# Patient Record
Sex: Male | Born: 1950 | Race: Black or African American | Hispanic: No | Marital: Married | Smoking: Current every day smoker
Health system: Southern US, Community
[De-identification: ages and names within clinical notes are randomized; demographics above are authoritative.]

## PROBLEM LIST (undated history)

## (undated) DIAGNOSIS — E785 Hyperlipidemia, unspecified: Secondary | ICD-10-CM

## (undated) DIAGNOSIS — Z720 Tobacco use: Secondary | ICD-10-CM

## (undated) DIAGNOSIS — I10 Essential (primary) hypertension: Secondary | ICD-10-CM

## (undated) HISTORY — DX: Tobacco use: Z72.0

## (undated) HISTORY — DX: Essential (primary) hypertension: I10

## (undated) HISTORY — DX: Hyperlipidemia, unspecified: E78.5

---

## 1978-02-27 HISTORY — PX: VOCAL CORD LATERALIZATION, ENDOSCOPIC APPROACH W/ MLB: SHX2664

## 2014-01-07 LAB — PSA

## 2014-03-17 ENCOUNTER — Ambulatory Visit: Payer: Self-pay | Admitting: Gastroenterology

## 2014-03-17 LAB — HM COLONOSCOPY

## 2014-06-22 LAB — SURGICAL PATHOLOGY

## 2014-10-14 ENCOUNTER — Ambulatory Visit (INDEPENDENT_AMBULATORY_CARE_PROVIDER_SITE_OTHER): Payer: Managed Care, Other (non HMO) | Admitting: Unknown Physician Specialty

## 2014-10-14 ENCOUNTER — Encounter: Payer: Self-pay | Admitting: Unknown Physician Specialty

## 2014-10-14 VITALS — BP 125/79 | HR 87 | Temp 98.7°F | Ht 72.2 in | Wt 207.6 lb

## 2014-10-14 DIAGNOSIS — Z720 Tobacco use: Secondary | ICD-10-CM

## 2014-10-14 DIAGNOSIS — L729 Follicular cyst of the skin and subcutaneous tissue, unspecified: Secondary | ICD-10-CM

## 2014-10-14 DIAGNOSIS — Q181 Preauricular sinus and cyst: Secondary | ICD-10-CM

## 2014-10-14 DIAGNOSIS — E7800 Pure hypercholesterolemia, unspecified: Secondary | ICD-10-CM | POA: Insufficient documentation

## 2014-10-14 DIAGNOSIS — I10 Essential (primary) hypertension: Secondary | ICD-10-CM

## 2014-10-14 DIAGNOSIS — E785 Hyperlipidemia, unspecified: Secondary | ICD-10-CM

## 2014-10-14 LAB — LIPID PANEL PICCOLO, WAIVED
CHOL/HDL RATIO PICCOLO,WAIVE: 3.5 mg/dL
Cholesterol Piccolo, Waived: 190 mg/dL (ref <200)
HDL CHOL PICCOLO, WAIVED: 55 mg/dL — AB (ref >59)
LDL CHOL CALC PICCOLO WAIVED: 77 mg/dL (ref <100)
TRIGLYCERIDES PICCOLO,WAIVED: 291 mg/dL — AB (ref <150)
VLDL CHOL CALC PICCOLO,WAIVE: 58 mg/dL — AB (ref <30)

## 2014-10-14 MED ORDER — AMLODIPINE BESYLATE 5 MG PO TABS: 5.0000 mg | ORAL_TABLET | Freq: Every day | ORAL | Status: DC

## 2014-10-14 NOTE — Progress Notes (Signed)
BP 125/79 mmHg  Pulse 87  Temp(Src) 98.7 F (37.1 C)  Ht 6' 0.2" (1.834 m)  Wt 207 lb 9.6 oz (94.167 kg)  BMI 28.00 kg/m2  SpO2 99%   Subjective:    Patient ID: James Davidson, male    DOB: 02/21/1951, 64 y.o.   MRN: [id number]  HPI: James Davidson is a 64 y.o. male  Chief Complaint  Patient presents with  . Hyperlipidemia  . Hypertension   HPI Comments: Reviewed ASCVC calculator last visit.  Lost weight but doesn't remember anything being wrong with cholesterol.     Hypertension This is a chronic (Taking Lisinopril and Lotensin) problem. The problem is controlled. Pertinent negatives include no chest pain, malaise/fatigue, palpitations or shortness of breath. (Complaining of ED, dizzyness upon standing, and occasional lip swelling) The current treatment provides significant improvement. There are no compliance problems.     Relevant past medical, surgical, family and social history reviewed and updated as indicated. Interim medical history since our last visit reviewed. Allergies and medications reviewed and updated.  Review of Systems  Constitutional: Negative for malaise/fatigue.  HENT:       Large non-tender lump behind left ear for quite some time  Respiratory: Negative for shortness of breath.   Cardiovascular: Negative for chest pain and palpitations.    Per HPI unless specifically indicated above     Objective:    BP 125/79 mmHg  Pulse 87  Temp(Src) 98.7 F (37.1 C)  Ht 6' 0.2" (1.834 m)  Wt 207 lb 9.6 oz (94.167 kg)  BMI 28.00 kg/m2  SpO2 99%  Wt Readings from Last 3 Encounters:  10/14/14 207 lb 9.6 oz (94.167 kg)  05/15/14 215 lb (97.523 kg)    Physical Exam  Constitutional: He is oriented to person, place, and time. He appears well-developed and well-nourished. No distress.  HENT:  Head: Normocephalic and atraumatic.  Large lump behind left ear.  Has been present "for awhile."  Eyes: Conjunctivae and lids are normal. Right eye exhibits no  discharge. Left eye exhibits no discharge. No scleral icterus.  Cardiovascular: Normal rate, regular rhythm and normal heart sounds.   Pulmonary/Chest: Effort normal and breath sounds normal. No respiratory distress.  Abdominal: Normal appearance. There is no splenomegaly or hepatomegaly.  Musculoskeletal: Normal range of motion.  Neurological: He is alert and oriented to person, place, and time.  Skin: Skin is intact. No rash noted. No pallor.  Psychiatric: He has a normal mood and affect. His behavior is normal. Judgment and thought content normal.  Vitals reviewed.    Assessment & Plan:   Problem List Items Addressed This Visit      Unprioritized   Hypertension - Primary    Pt with side effects of ED, lip swelling, and dizzyness.  Will stop Lisinopril 20 mg by titrating down over 2 weeks.  Break in half for 2 weeks and then stop        Relevant Medications   amLODipine (NORVASC) 5 MG tablet   Other Relevant Orders   Lipid Panel Piccolo, Waived   Comprehensive metabolic panel   Hyperlipidemia    ASCVD calculator recommends statin therapy.  Pt refuses      Relevant Medications   amLODipine (NORVASC) 5 MG tablet   Other Relevant Orders   Lipid Panel Piccolo, Waived    Other Visit Diagnoses    Cyst on ear        Refer to ENT    Relevant Orders    Ambulatory referral  to ENT        Follow up plan: No Follow-up on file.

## 2014-10-14 NOTE — Patient Instructions (Addendum)
Will stop Lisinopril 20 mg by titrating down over 2 weeks.  Break in half for 2 weeks and then stop .  I need to see you back in 4 weeks to see if your BP is doing OK.

## 2014-10-14 NOTE — Assessment & Plan Note (Signed)
Pt with side effects of ED, lip swelling, and dizzyness.  Will stop Lisinopril 20 mg by titrating down over 2 weeks.  Break in half for 2 weeks and then stop

## 2014-10-14 NOTE — Assessment & Plan Note (Signed)
ASCVD calculator recommends statin therapy.  Pt refuses

## 2014-10-15 LAB — COMPREHENSIVE METABOLIC PANEL
ALK PHOS: 108 IU/L (ref 39–117)
ALT: 14 IU/L (ref 0–44)
AST: 21 IU/L (ref 0–40)
Albumin/Globulin Ratio: 1.5 (ref 1.1–2.5)
Albumin: 4.1 g/dL (ref 3.6–4.8)
BILIRUBIN TOTAL: 0.3 mg/dL (ref 0.0–1.2)
BUN/Creatinine Ratio: 25 — ABNORMAL HIGH (ref 10–22)
BUN: 24 mg/dL (ref 8–27)
CHLORIDE: 99 mmol/L (ref 97–108)
CO2: 20 mmol/L (ref 18–29)
Calcium: 9.3 mg/dL (ref 8.6–10.2)
Creatinine, Ser: 0.96 mg/dL (ref 0.76–1.27)
GFR calc Af Amer: 97 mL/min/{1.73_m2} (ref >59)
GFR calc non Af Amer: 84 mL/min/{1.73_m2} (ref >59)
GLUCOSE: 94 mg/dL (ref 65–99)
Globulin, Total: 2.7 g/dL (ref 1.5–4.5)
Potassium: 4.2 mmol/L (ref 3.5–5.2)
Sodium: 138 mmol/L (ref 134–144)
Total Protein: 6.8 g/dL (ref 6.0–8.5)

## 2014-10-16 ENCOUNTER — Other Ambulatory Visit: Payer: Self-pay | Admitting: Unknown Physician Specialty

## 2014-10-16 DIAGNOSIS — H938X2 Other specified disorders of left ear: Secondary | ICD-10-CM

## 2014-10-22 ENCOUNTER — Ambulatory Visit
Admission: RE | Admit: 2014-10-22 | Discharge: 2014-10-22 | Disposition: A | Discharge status: Home / Self Care | Payer: Managed Care, Other (non HMO) | Source: Ambulatory Visit | Attending: Unknown Physician Specialty | Admitting: Unknown Physician Specialty

## 2014-10-22 DIAGNOSIS — M47812 Spondylosis without myelopathy or radiculopathy, cervical region: Secondary | ICD-10-CM | POA: Diagnosis not present

## 2014-10-22 DIAGNOSIS — M4802 Spinal stenosis, cervical region: Secondary | ICD-10-CM | POA: Diagnosis not present

## 2014-10-22 DIAGNOSIS — H938X2 Other specified disorders of left ear: Secondary | ICD-10-CM | POA: Insufficient documentation

## 2014-10-22 DIAGNOSIS — J329 Chronic sinusitis, unspecified: Secondary | ICD-10-CM | POA: Diagnosis not present

## 2014-10-22 MED ORDER — IOHEXOL 300 MG/ML  SOLN
75.0000 mL | Freq: Once | INTRAMUSCULAR | Status: AC | PRN
  Administered 2014-10-22: 75 mL via INTRAVENOUS

## 2014-11-20 ENCOUNTER — Encounter: Payer: Self-pay | Admitting: Unknown Physician Specialty

## 2014-11-20 ENCOUNTER — Ambulatory Visit (INDEPENDENT_AMBULATORY_CARE_PROVIDER_SITE_OTHER): Payer: Managed Care, Other (non HMO) | Admitting: Unknown Physician Specialty

## 2014-11-20 VITALS — BP 133/73 | HR 64 | Temp 97.3°F | Ht 72.5 in | Wt 209.2 lb

## 2014-11-20 DIAGNOSIS — I10 Essential (primary) hypertension: Secondary | ICD-10-CM | POA: Diagnosis not present

## 2014-11-20 NOTE — Assessment & Plan Note (Signed)
Well controlled on amlodipine 5 mg.

## 2014-11-20 NOTE — Progress Notes (Signed)
BP 133/73 mmHg  Pulse 64  Temp(Src) 97.3 F (36.3 C)  Ht 6' 0.5" (1.842 m)  Wt 209 lb 3.2 oz (94.892 kg)  BMI 27.97 kg/m2  SpO2 97%   Subjective:    Patient ID: James Davidson, male    DOB: 13-Mar-1950, 64 y.o.   MRN: [id number]  HPI: James Davidson is a 64 y.o. male  Chief Complaint  Patient presents with  . Hypertension  . Gastrophageal Reflux    pt states has had indigestion on and off for about 3 months   Hypertension: Started on amlodipine about a month ago. Compliant with medication, no missed doses. Does not monitor blood pressure at home. Denies dizziness, chest pain, shortness of breath or headaches. Denies any side effects.  GERD: Has recently had episodes of indigestion after eating. Has not tried any over the counter medications. Suggested OTC zantac.   Relevant past medical, surgical, family and social history reviewed and updated as indicated. Interim medical history since our last visit reviewed. Allergies and medications reviewed and updated.  Review of Systems  Constitutional: Negative.  Negative for fever, chills, activity change and appetite change.  HENT: Negative for postnasal drip, rhinorrhea, sinus pressure and sore throat.   Eyes: Negative.  Negative for pain and discharge.  Respiratory: Negative.  Negative for cough, chest tightness, shortness of breath and wheezing.   Cardiovascular: Negative for chest pain, palpitations and leg swelling.  Musculoskeletal: Negative.  Negative for myalgias, back pain, joint swelling, arthralgias and gait problem.  Skin: Negative.  Negative for color change, pallor, rash and wound.  Psychiatric/Behavioral: Negative.  Negative for suicidal ideas, confusion, self-injury and decreased concentration. The patient is not nervous/anxious.     Per HPI unless specifically indicated above     Objective:    BP 133/73 mmHg  Pulse 64  Temp(Src) 97.3 F (36.3 C)  Ht 6' 0.5" (1.842 m)  Wt 209 lb 3.2 oz (94.892 kg)  BMI  27.97 kg/m2  SpO2 97%  Wt Readings from Last 3 Encounters:  11/20/14 209 lb 3.2 oz (94.892 kg)  10/14/14 207 lb 9.6 oz (94.167 kg)  05/15/14 215 lb (97.523 kg)    Physical Exam  Constitutional: He is oriented to person, place, and time. He appears well-developed and well-nourished. No distress.  HENT:  Head: Normocephalic and atraumatic.  Eyes: Conjunctivae are normal.  Neck: Normal range of motion.  Cardiovascular: Normal rate, regular rhythm and normal heart sounds.   Pulmonary/Chest: Effort normal and breath sounds normal. No respiratory distress. He has no wheezes. He has no rales. He exhibits no tenderness.  Musculoskeletal: Normal range of motion.  Neurological: He is alert and oriented to person, place, and time.  Skin: Skin is warm and dry. No rash noted. He is not diaphoretic. No erythema. No pallor.  Psychiatric: He has a normal mood and affect. His behavior is normal. Judgment and thought content normal.    Results for orders placed or performed in visit on 10/14/14  Lipid Panel Piccolo, [employer]  Result Value Ref Range   Cholesterol Piccolo, Waived 190 <200 mg/dL   HDL Chol Piccolo, Waived 55 (L) >59 mg/dL   Triglycerides Piccolo,Waived 291 (H) <150 mg/dL   Chol/HDL Ratio Piccolo,Waive 3.5 mg/dL   LDL Chol Calc Piccolo Waived 77 <100 mg/dL   VLDL Chol Calc Piccolo,Waive 58 (H) <30 mg/dL  Comprehensive metabolic panel  Result Value Ref Range   Glucose 94 65 - 99 mg/dL   BUN 24 8 - 27 mg/dL  Creatinine, Ser 0.96 0.76 - 1.27 mg/dL   GFR calc non Af Amer 84 >59 mL/min/1.73   GFR calc Af Amer 97 >59 mL/min/1.73   BUN/Creatinine Ratio 25 (H) 10 - 22   Sodium 138 134 - 144 mmol/L   Potassium 4.2 3.5 - 5.2 mmol/L   Chloride 99 97 - 108 mmol/L   CO2 20 18 - 29 mmol/L   Calcium 9.3 8.6 - 10.2 mg/dL   Total Protein 6.8 6.0 - 8.5 g/dL   Albumin 4.1 3.6 - 4.8 g/dL   Globulin, Total 2.7 1.5 - 4.5 g/dL   Albumin/Globulin Ratio 1.5 1.1 - 2.5   Bilirubin Total 0.3 0.0 - 1.2  mg/dL   Alkaline Phosphatase 108 39 - 117 IU/L   AST 21 0 - 40 IU/L   ALT 14 0 - 44 IU/L      Assessment & Plan:   Problem List Items Addressed This Visit      Unprioritized   Hypertension - Primary    Well controlled on amlodipine .           Follow up plan: Return in about 6 months (around 05/20/2015).

## 2014-12-23 ENCOUNTER — Other Ambulatory Visit: Payer: Self-pay | Admitting: Unknown Physician Specialty

## 2014-12-29 HISTORY — PX: EXTERNAL EAR SURGERY: SHX627

## 2015-03-03 ENCOUNTER — Other Ambulatory Visit: Payer: Self-pay | Admitting: Unknown Physician Specialty

## 2015-04-07 ENCOUNTER — Other Ambulatory Visit: Payer: Self-pay | Admitting: Unknown Physician Specialty

## 2015-05-21 ENCOUNTER — Ambulatory Visit (INDEPENDENT_AMBULATORY_CARE_PROVIDER_SITE_OTHER): Payer: Managed Care, Other (non HMO) | Admitting: Unknown Physician Specialty

## 2015-05-21 ENCOUNTER — Encounter: Payer: Self-pay | Admitting: Unknown Physician Specialty

## 2015-05-21 VITALS — BP 146/69 | HR 76 | Temp 98.2°F | Ht 72.2 in | Wt 213.2 lb

## 2015-05-21 DIAGNOSIS — Z720 Tobacco use: Secondary | ICD-10-CM | POA: Diagnosis not present

## 2015-05-21 DIAGNOSIS — I10 Essential (primary) hypertension: Secondary | ICD-10-CM

## 2015-05-21 MED ORDER — AMLODIPINE BESYLATE 5 MG PO TABS: 5.0000 mg | ORAL_TABLET | Freq: Every day | ORAL | Status: DC

## 2015-05-21 NOTE — Assessment & Plan Note (Signed)
Encouraged to quit. 

## 2015-05-21 NOTE — Assessment & Plan Note (Signed)
Stable, continue present medications.   

## 2015-05-21 NOTE — Progress Notes (Signed)
BP 146/69 mmHg  Pulse 76  Temp(Src) 98.2 F (36.8 C)  Ht 6' 0.2" (1.834 m)  Wt 213 lb 3.2 oz (96.707 kg)  BMI 28.75 kg/m2  SpO2 97%   Subjective:    Patient ID: James Davidson, male    DOB: May 07, 1950, 65 y.o.   MRN: [id number]  HPI: James Davidson is a 65 y.o. male  Chief Complaint  Patient presents with  . Hypertension   Hypertension Using medications without difficulty Average home BPs checks occasionally but doesn't know what they are.     No problems or lightheadedness No chest pain with exertion or shortness of breath No Edema  Tobacco Smokes 1/3 ppd.  Not interested in quitting  Relevant past medical, surgical, family and social history reviewed and updated as indicated. Interim medical history since our last visit reviewed. Allergies and medications reviewed and updated.  Review of Systems  Per HPI unless specifically indicated above     Objective:    BP 146/69 mmHg  Pulse 76  Temp(Src) 98.2 F (36.8 C)  Ht 6' 0.2" (1.834 m)  Wt 213 lb 3.2 oz (96.707 kg)  BMI 28.75 kg/m2  SpO2 97%  Wt Readings from Last 3 Encounters:  05/21/15 213 lb 3.2 oz (96.707 kg)  11/20/14 209 lb 3.2 oz (94.892 kg)  10/14/14 207 lb 9.6 oz (94.167 kg)    Physical Exam  Constitutional: He is oriented to person, place, and time. He appears well-developed and well-nourished. No distress.  HENT:  Head: Normocephalic and atraumatic.  Eyes: Conjunctivae and lids are normal. Right eye exhibits no discharge. Left eye exhibits no discharge. No scleral icterus.  Neck: Normal range of motion. Neck supple. No JVD present. Carotid bruit is not present.  Cardiovascular: Normal rate, regular rhythm and normal heart sounds.   Pulmonary/Chest: Effort normal and breath sounds normal. No respiratory distress.  Abdominal: Normal appearance. There is no splenomegaly or hepatomegaly.  Musculoskeletal: Normal range of motion.  Neurological: He is alert and oriented to person, place, and time.   Skin: Skin is warm, dry and intact. No rash noted. No pallor.  Psychiatric: He has a normal mood and affect. His behavior is normal. Judgment and thought content normal.    Results for orders placed or performed in visit on 10/14/14  Lipid Panel Piccolo, [employer]  Result Value Ref Range   Cholesterol Piccolo, Waived 190 <200 mg/dL   HDL Chol Piccolo, Waived 55 (L) >59 mg/dL   Triglycerides Piccolo,Waived 291 (H) <150 mg/dL   Chol/HDL Ratio Piccolo,Waive 3.5 mg/dL   LDL Chol Calc Piccolo Waived 77 <100 mg/dL   VLDL Chol Calc Piccolo,Waive 58 (H) <30 mg/dL  Comprehensive metabolic panel  Result Value Ref Range   Glucose 94 65 - 99 mg/dL   BUN 24 8 - 27 mg/dL   Creatinine, Ser 8.650.96 0.76 - 1.27 mg/dL   GFR calc non Af Amer 84 >59 mL/min/1.73   GFR calc Af Amer 97 >59 mL/min/1.73   BUN/Creatinine Ratio 25 (H) 10 - 22   Sodium 138 134 - 144 mmol/L   Potassium 4.2 3.5 - 5.2 mmol/L   Chloride 99 97 - 108 mmol/L   CO2 20 18 - 29 mmol/L   Calcium 9.3 8.6 - 10.2 mg/dL   Total Protein 6.8 6.0 - 8.5 g/dL   Albumin 4.1 3.6 - 4.8 g/dL   Globulin, Total 2.7 1.5 - 4.5 g/dL   Albumin/Globulin Ratio 1.5 1.1 - 2.5   Bilirubin Total 0.3 0.0 - 1.2  mg/dL   Alkaline Phosphatase 108 39 - 117 IU/L   AST 21 0 - 40 IU/L   ALT 14 0 - 44 IU/L      Assessment & Plan:   Problem List Items Addressed This Visit      Unprioritized   Hypertension - Primary    Stable, continue present medications.        Tobacco use    Encouraged to quit          Follow up plan: Return in about 3 months (around 08/21/2015) for physical.

## 2015-08-23 ENCOUNTER — Ambulatory Visit (INDEPENDENT_AMBULATORY_CARE_PROVIDER_SITE_OTHER): Payer: Managed Care, Other (non HMO) | Admitting: Unknown Physician Specialty

## 2015-08-23 ENCOUNTER — Encounter: Payer: Self-pay | Admitting: Unknown Physician Specialty

## 2015-08-23 VITALS — BP 132/79 | HR 81 | Temp 98.0°F | Ht 72.5 in | Wt 218.6 lb

## 2015-08-23 DIAGNOSIS — Z0000 Encounter for general adult medical examination without abnormal findings: Secondary | ICD-10-CM

## 2015-08-23 DIAGNOSIS — Z720 Tobacco use: Secondary | ICD-10-CM

## 2015-08-23 DIAGNOSIS — K219 Gastro-esophageal reflux disease without esophagitis: Secondary | ICD-10-CM | POA: Insufficient documentation

## 2015-08-23 DIAGNOSIS — I10 Essential (primary) hypertension: Secondary | ICD-10-CM

## 2015-08-23 MED ORDER — OMEPRAZOLE 20 MG PO CPDR: 20.0000 mg | DELAYED_RELEASE_CAPSULE | Freq: Every day | ORAL | Status: DC

## 2015-08-23 MED ORDER — VALSARTAN 80 MG PO TABS: 80.0000 mg | ORAL_TABLET | Freq: Every day | ORAL | Status: DC

## 2015-08-23 NOTE — Assessment & Plan Note (Signed)
Encouraged to quit.  Schedule low dose CT 

## 2015-08-23 NOTE — Assessment & Plan Note (Signed)
ED with Amlodipine.  Will change to an ARB

## 2015-08-23 NOTE — Progress Notes (Signed)
BP 132/79 mmHg  Pulse 81  Temp(Src) 98 F (36.7 C)  Ht 6' 0.5" (1.842 m)  Wt 218 lb 9.6 oz (99.156 kg)  BMI 29.22 kg/m2  SpO2 94%   Subjective:    Patient ID: James Davidson, male    DOB: Jun 27, 1950, 65 y.o.   MRN: [id number]  HPI: James Davidson is a 65 y.o. male  Chief Complaint  Patient presents with  . Annual Exam    Hep C and HIV orders entered  . Gastroesophageal Reflux    pt states he wants to talk to provider about taking prilosec   Hypertension Using medications without difficulty but states that ever since he has been on the medication "ED" has set in and having trouble having and maintaining an erection Average home BPs Not checking   No problems or lightheadedness No chest pain with exertion or shortness of breath No Edema   GERD Pt with indigestion and feeling like he needs to burp.  Sometimes it makes him sick.  He took OTC Prilosec which has resolved the issues.    Tobacco use 1/2 ppd.  He has smoked for many years and smokes to relieve stress  Relevant past medical, surgical, family and social history reviewed and updated as indicated. Interim medical history since our last visit reviewed. Allergies and medications reviewed and updated.  Review of Systems  Constitutional: Negative.   HENT: Negative.   Eyes: Negative.   Respiratory: Negative.   Cardiovascular: Negative.   Gastrointestinal: Negative.   Endocrine: Negative.   Musculoskeletal: Negative.   Skin: Negative.   Allergic/Immunologic: Negative.   Neurological: Negative.   Hematological: Negative.   Psychiatric/Behavioral: Negative.     Per HPI unless specifically indicated above     Objective:    BP 132/79 mmHg  Pulse 81  Temp(Src) 98 F (36.7 C)  Ht 6' 0.5" (1.842 m)  Wt 218 lb 9.6 oz (99.156 kg)  BMI 29.22 kg/m2  SpO2 94%  Wt Readings from Last 3 Encounters:  08/23/15 218 lb 9.6 oz (99.156 kg)  05/21/15 213 lb 3.2 oz (96.707 kg)  11/20/14 209 lb 3.2 oz (94.892 kg)     Physical Exam  Constitutional: He is oriented to person, place, and time. He appears well-developed and well-nourished.  HENT:  Head: Normocephalic.  Right Ear: Tympanic membrane, external ear and ear canal normal.  Left Ear: Tympanic membrane, external ear and ear canal normal.  Mouth/Throat: Uvula is midline, oropharynx is clear and moist and mucous membranes are normal.  Eyes: Pupils are equal, round, and reactive to light.  Cardiovascular: Normal rate, regular rhythm and normal heart sounds.  Exam reveals no gallop and no friction rub.   No murmur heard. Pulmonary/Chest: Effort normal and breath sounds normal. No respiratory distress.  Abdominal: Soft. Bowel sounds are normal. He exhibits no distension. There is no tenderness.  Genitourinary: Rectum normal and prostate normal.  Musculoskeletal: Normal range of motion.  Neurological: He is alert and oriented to person, place, and time. He has normal reflexes.  Skin: Skin is warm and dry.  Psychiatric: He has a normal mood and affect. His behavior is normal. Judgment and thought content normal.    Results for orders placed or performed in visit on 10/14/14  Lipid Panel Piccolo, [employer]  Result Value Ref Range   Cholesterol Piccolo, Waived 190 <200 mg/dL   HDL Chol Piccolo, Waived 55 (L) >59 mg/dL   Triglycerides Piccolo,Waived 291 (H) <150 mg/dL   Chol/HDL Ratio Piccolo,Waive 3.5  mg/dL   LDL Chol Calc Piccolo Waived 77 <100 mg/dL   VLDL Chol Calc Piccolo,Waive 58 (H) <30 mg/dL  Comprehensive metabolic panel  Result Value Ref Range   Glucose 94 65 - 99 mg/dL   BUN 24 8 - 27 mg/dL   Creatinine, Ser 5.620.96 0.76 - 1.27 mg/dL   GFR calc non Af Amer 84 >59 mL/min/1.73   GFR calc Af Amer 97 >59 mL/min/1.73   BUN/Creatinine Ratio 25 (H) 10 - 22   Sodium 138 134 - 144 mmol/L   Potassium 4.2 3.5 - 5.2 mmol/L   Chloride 99 97 - 108 mmol/L   CO2 20 18 - 29 mmol/L   Calcium 9.3 8.6 - 10.2 mg/dL   Total Protein 6.8 6.0 - 8.5 g/dL    Albumin 4.1 3.6 - 4.8 g/dL   Globulin, Total 2.7 1.5 - 4.5 g/dL   Albumin/Globulin Ratio 1.5 1.1 - 2.5   Bilirubin Total 0.3 0.0 - 1.2 mg/dL   Alkaline Phosphatase 108 39 - 117 IU/L   AST 21 0 - 40 IU/L   ALT 14 0 - 44 IU/L      Assessment & Plan:   Problem List Items Addressed This Visit      Unprioritized   GERD (gastroesophageal reflux disease)    Rx for Omeprazole. Discussed to start to calm down symptoms and encouraged to wean off      Relevant Medications   omeprazole (PRILOSEC) 20 MG capsule   Hypertension    ED with Amlodipine.  Will change to an ARB      Relevant Medications   valsartan (DIOVAN) 80 MG tablet   Tobacco use    Encouraged to quit.  Schedule low dose CT       Other Visit Diagnoses    Health care maintenance    -  Primary    Relevant Orders    Hepatitis C antibody    HIV antibody        Follow up plan: Return in about 4 weeks (around 09/20/2015).

## 2015-08-23 NOTE — Assessment & Plan Note (Signed)
Rx for Omeprazole. Discussed to start to calm down symptoms and encouraged to wean off

## 2015-08-24 LAB — HEPATITIS C ANTIBODY: Hep C Virus Ab: <0.1 s/co ratio (ref 0.0–0.9)

## 2015-08-24 LAB — HIV ANTIBODY (ROUTINE TESTING W REFLEX): HIV SCREEN 4TH GENERATION: NONREACTIVE

## 2015-09-01 ENCOUNTER — Telehealth: Payer: Self-pay | Admitting: *Deleted

## 2015-09-01 NOTE — Telephone Encounter (Signed)
Received referral for initial lung cancer screening scan. Contacted patient and obtained smoking history, (20 pack year)which is less than current accepted criteria of 30 pack year history. Discussed smoking cessation programs available here at [facility] as well as encouraging continued discussion about screening in the future if he continues to smoke. Patient verbalizes understanding.

## 2015-09-21 ENCOUNTER — Ambulatory Visit: Payer: Managed Care, Other (non HMO) | Admitting: Unknown Physician Specialty

## 2015-11-03 ENCOUNTER — Other Ambulatory Visit: Payer: Self-pay | Admitting: Unknown Physician Specialty

## 2015-12-09 ENCOUNTER — Other Ambulatory Visit: Payer: Self-pay | Admitting: Family Medicine

## 2015-12-31 ENCOUNTER — Ambulatory Visit (INDEPENDENT_AMBULATORY_CARE_PROVIDER_SITE_OTHER): Payer: Medicare Other | Admitting: Unknown Physician Specialty

## 2015-12-31 ENCOUNTER — Encounter: Payer: Self-pay | Admitting: Unknown Physician Specialty

## 2015-12-31 DIAGNOSIS — E782 Mixed hyperlipidemia: Secondary | ICD-10-CM | POA: Diagnosis not present

## 2015-12-31 DIAGNOSIS — K219 Gastro-esophageal reflux disease without esophagitis: Secondary | ICD-10-CM

## 2015-12-31 DIAGNOSIS — Z720 Tobacco use: Secondary | ICD-10-CM

## 2015-12-31 DIAGNOSIS — I10 Essential (primary) hypertension: Secondary | ICD-10-CM

## 2015-12-31 MED ORDER — VALSARTAN 80 MG PO TABS: 80.0000 mg | ORAL_TABLET | Freq: Every day | ORAL | 0 refills | Status: DC

## 2015-12-31 MED ORDER — VALSARTAN 80 MG PO TABS: 80.0000 mg | ORAL_TABLET | Freq: Every day | ORAL | 1 refills | Status: DC

## 2015-12-31 NOTE — Assessment & Plan Note (Signed)
Will recheck today. He did eat a piece of chicken this morning.

## 2015-12-31 NOTE — Progress Notes (Signed)
BP (!) 169/100 (BP Location: Left Arm, Patient Position: Sitting, Cuff Size: Large)   Pulse 84   Temp 97.8 F (36.6 C)   Ht 6' 1.1" (1.857 m)   Wt 227 lb 6.4 oz (103.1 kg)   SpO2 97%   BMI 29.92 kg/m    Subjective:    Patient ID: James Davidson, male    DOB: 10-Sep-1950, 65 y.o.   MRN: [id number]  HPI: James Davidson is a 65 y.o. male  Chief Complaint  Patient presents with  . Hypertension  . Gastroesophageal Reflux    Hypertension  Has been without Valsartan for at least 6 weeks. Wasn't having any difficulties before he ran out.   Average home BP  Responded well to valsartan when he was taking it. Reports it has been high the last couple of weeks.   Using medication without problems or lightheadedness  No chest pain with exertion or shortness of breath No Edema  GERD Taking prilosec, not currently having symptoms.  Relevant past medical, surgical, family and social history reviewed and updated as indicated. Interim medical history since our last visit reviewed. Allergies and medications reviewed and updated.  Review of Systems  Constitutional: Negative.   Respiratory: Negative.   Cardiovascular: Negative.   Neurological: Negative.     Per HPI unless specifically indicated above     Objective:    BP (!) 169/100 (BP Location: Left Arm, Patient Position: Sitting, Cuff Size: Large)   Pulse 84   Temp 97.8 F (36.6 C)   Ht 6' 1.1" (1.857 m)   Wt 227 lb 6.4 oz (103.1 kg)   SpO2 97%   BMI 29.92 kg/m   Wt Readings from Last 3 Encounters:  12/31/15 227 lb 6.4 oz (103.1 kg)  08/23/15 218 lb 9.6 oz (99.2 kg)  05/21/15 213 lb 3.2 oz (96.7 kg)    Physical Exam  Constitutional: He is oriented to person, place, and time. He appears well-developed and well-nourished. No distress.  Cardiovascular: Normal rate, regular rhythm and normal heart sounds.   Pulses:      Radial pulses are 2+ on the right side, and 2+ on the left side.  No carotid bruits noted.     Pulmonary/Chest: Effort normal and breath sounds normal.  Musculoskeletal: He exhibits no edema.  Neurological: He is alert and oriented to person, place, and time.  Psychiatric: He has a normal mood and affect. His behavior is normal. Judgment and thought content normal.    Results for orders placed or performed in visit on 08/23/15  Hepatitis C antibody  Result Value Ref Range   Hep C Virus Ab <0.1 0.0 - 0.9 s/co ratio  HIV antibody  Result Value Ref Range   HIV Screen 4th Generation wRfx Non Reactive Non Reactive      Assessment & Plan:   Problem List Items Addressed This Visit      Unprioritized   GERD (gastroesophageal reflux disease)    Stable, continue current medication.       Hyperlipidemia    Will recheck today. He did eat a piece of chicken this morning.       Relevant Medications   valsartan (DIOVAN) 80 MG tablet   Hypertension    Blood pressure elevated. This is likely due to patient running out of medication several weeks ago. Will restart Valsartan today.       Relevant Medications   valsartan (DIOVAN) 80 MG tablet   Other Relevant Orders   Comprehensive metabolic panel  Lipid Panel w/o Chol/HDL Ratio   Tobacco use    Still smoking, although he reports he has cut back, he could not tell me how many cigarettes a day he was smoking. Discussed importance of cessation and reviewed options for quitting.        Other Visit Diagnoses   None.      Follow up plan: Return in about 4 weeks (around 01/28/2016).

## 2015-12-31 NOTE — Assessment & Plan Note (Signed)
Blood pressure elevated. This is likely due to patient running out of medication several weeks ago. Will restart Valsartan today.

## 2015-12-31 NOTE — Assessment & Plan Note (Signed)
Stable, continue current medication.

## 2015-12-31 NOTE — Assessment & Plan Note (Signed)
Still smoking, although he reports he has cut back, he could not tell me how many cigarettes a day he was smoking. Discussed importance of cessation and reviewed options for quitting.

## 2016-01-01 LAB — COMPREHENSIVE METABOLIC PANEL
ALT: 21 IU/L (ref 0–44)
AST: 27 IU/L (ref 0–40)
Albumin/Globulin Ratio: 1.5 (ref 1.2–2.2)
Albumin: 4.1 g/dL (ref 3.6–4.8)
Alkaline Phosphatase: 101 IU/L (ref 39–117)
BUN/Creatinine Ratio: 14 (ref 10–24)
BUN: 17 mg/dL (ref 8–27)
Bilirubin Total: 0.3 mg/dL (ref 0.0–1.2)
CALCIUM: 9.4 mg/dL (ref 8.6–10.2)
CO2: 20 mmol/L (ref 18–29)
CREATININE: 1.19 mg/dL (ref 0.76–1.27)
Chloride: 104 mmol/L (ref 96–106)
GFR calc Af Amer: 74 mL/min/{1.73_m2} (ref >59)
GFR, EST NON AFRICAN AMERICAN: 64 mL/min/{1.73_m2} (ref >59)
GLUCOSE: 104 mg/dL — AB (ref 65–99)
Globulin, Total: 2.7 g/dL (ref 1.5–4.5)
POTASSIUM: 4.2 mmol/L (ref 3.5–5.2)
Sodium: 140 mmol/L (ref 134–144)
Total Protein: 6.8 g/dL (ref 6.0–8.5)

## 2016-01-01 LAB — LIPID PANEL W/O CHOL/HDL RATIO
Cholesterol, Total: 222 mg/dL — ABNORMAL HIGH (ref 100–199)
HDL: 53 mg/dL (ref >39)
LDL CALC: 141 mg/dL — AB (ref 0–99)
TRIGLYCERIDES: 139 mg/dL (ref 0–149)
VLDL Cholesterol Cal: 28 mg/dL (ref 5–40)

## 2016-01-28 ENCOUNTER — Encounter: Payer: Self-pay | Admitting: Unknown Physician Specialty

## 2016-01-28 ENCOUNTER — Ambulatory Visit: Payer: Medicare Other | Admitting: Unknown Physician Specialty

## 2016-01-28 ENCOUNTER — Ambulatory Visit (INDEPENDENT_AMBULATORY_CARE_PROVIDER_SITE_OTHER): Payer: Medicare Other | Admitting: Unknown Physician Specialty

## 2016-01-28 DIAGNOSIS — E782 Mixed hyperlipidemia: Secondary | ICD-10-CM | POA: Diagnosis not present

## 2016-01-28 DIAGNOSIS — I10 Essential (primary) hypertension: Secondary | ICD-10-CM

## 2016-01-28 MED ORDER — VALSARTAN 80 MG PO TABS: 80.0000 mg | ORAL_TABLET | Freq: Every day | ORAL | 1 refills | Status: DC

## 2016-01-28 NOTE — Assessment & Plan Note (Signed)
Not quite to goal but improved.  He's going to start exercising.

## 2016-01-28 NOTE — Assessment & Plan Note (Signed)
Refusing statins for now.  Recheck in 3 months

## 2016-01-28 NOTE — Progress Notes (Signed)
BP 136/82 (BP Location: Left Arm, Cuff Size: Large)   Pulse 81   Temp 97.4 F (36.3 C)   Wt 231 lb 12.8 oz (105.1 kg)   SpO2 97%   BMI 30.50 kg/m    Subjective:    Patient ID: James Davidson, male    DOB: 1950-06-20, 65 y.o.   MRN: [id number]  HPI: James Davidson is a 65 y.o. male  Chief Complaint  Patient presents with  . Hypertension    1 month f/up   Hypertension  Using medications without difficulty Average home BPs   Using medication without problems or lightheadedness No chest pain with exertion or shortness of breath No Edema  Hyperlipidemia Reviewed ASCVD calculator which recommends high intensity statin.    Relevant past medical, surgical, family and social history reviewed and updated as indicated. Interim medical history since our last visit reviewed. Allergies and medications reviewed and updated.  Review of Systems  Per HPI unless specifically indicated above     Objective:    BP 136/82 (BP Location: Left Arm, Cuff Size: Large)   Pulse 81   Temp 97.4 F (36.3 C)   Wt 231 lb 12.8 oz (105.1 kg)   SpO2 97%   BMI 30.50 kg/m   Wt Readings from Last 3 Encounters:  01/28/16 231 lb 12.8 oz (105.1 kg)  12/31/15 227 lb 6.4 oz (103.1 kg)  08/23/15 218 lb 9.6 oz (99.2 kg)    Physical Exam  Constitutional: He is oriented to person, place, and time. He appears well-developed and well-nourished. No distress.  HENT:  Head: Normocephalic and atraumatic.  Eyes: Conjunctivae and lids are normal. Right eye exhibits no discharge. Left eye exhibits no discharge. No scleral icterus.  Neck: Normal range of motion. Neck supple. No JVD present. Carotid bruit is not present.  Cardiovascular: Normal rate, regular rhythm and normal heart sounds.   Pulmonary/Chest: Effort normal and breath sounds normal. No respiratory distress.  Abdominal: Normal appearance. There is no splenomegaly or hepatomegaly.  Musculoskeletal: Normal range of motion.  Neurological: He is alert  and oriented to person, place, and time.  Skin: Skin is warm, dry and intact. No rash noted. No pallor.  Psychiatric: He has a normal mood and affect. His behavior is normal. Judgment and thought content normal.    Results for orders placed or performed in visit on 12/31/15  Comprehensive metabolic panel  Result Value Ref Range   Glucose 104 (H) 65 - 99 mg/dL   BUN 17 8 - 27 mg/dL   Creatinine, Ser 4.091.19 0.76 - 1.27 mg/dL   GFR calc non Af Amer 64 >59 mL/min/1.73   GFR calc Af Amer 74 >59 mL/min/1.73   BUN/Creatinine Ratio 14 10 - 24   Sodium 140 134 - 144 mmol/L   Potassium 4.2 3.5 - 5.2 mmol/L   Chloride 104 96 - 106 mmol/L   CO2 20 18 - 29 mmol/L   Calcium 9.4 8.6 - 10.2 mg/dL   Total Protein 6.8 6.0 - 8.5 g/dL   Albumin 4.1 3.6 - 4.8 g/dL   Globulin, Total 2.7 1.5 - 4.5 g/dL   Albumin/Globulin Ratio 1.5 1.2 - 2.2   Bilirubin Total 0.3 0.0 - 1.2 mg/dL   Alkaline Phosphatase 101 39 - 117 IU/L   AST 27 0 - 40 IU/L   ALT 21 0 - 44 IU/L  Lipid Panel w/o Chol/HDL Ratio  Result Value Ref Range   Cholesterol, Total 222 (H) 100 - 199 mg/dL   Triglycerides  139 0 - 149 mg/dL   HDL 53 >16>39 mg/dL   VLDL Cholesterol Cal 28 5 - 40 mg/dL   LDL Calculated 109141 (H) 0 - 99 mg/dL      Assessment & Plan:   Problem List Items Addressed This Visit      Unprioritized   Hyperlipidemia    Refusing statins for now.  Recheck in 3 months      Hypertension    Not quite to goal but improved.  He's going to start exercising.           Recheck BP and cholesterol in 3 months  Follow up plan: Return in about 3 months (around 04/27/2016).

## 2016-04-28 ENCOUNTER — Ambulatory Visit: Payer: Medicare Other | Admitting: Unknown Physician Specialty

## 2016-05-02 ENCOUNTER — Encounter: Payer: Self-pay | Admitting: Unknown Physician Specialty

## 2016-05-02 ENCOUNTER — Ambulatory Visit (INDEPENDENT_AMBULATORY_CARE_PROVIDER_SITE_OTHER): Payer: Medicare Other | Admitting: Unknown Physician Specialty

## 2016-05-02 VITALS — BP 160/100 | HR 78 | Temp 98.5°F | Ht 73.2 in | Wt 231.6 lb

## 2016-05-02 DIAGNOSIS — Z720 Tobacco use: Secondary | ICD-10-CM

## 2016-05-02 DIAGNOSIS — E782 Mixed hyperlipidemia: Secondary | ICD-10-CM | POA: Diagnosis not present

## 2016-05-02 DIAGNOSIS — I10 Essential (primary) hypertension: Secondary | ICD-10-CM

## 2016-05-02 LAB — LIPID PANEL PICCOLO, WAIVED
CHOL/HDL RATIO PICCOLO,WAIVE: 3.1 mg/dL
Cholesterol Piccolo, Waived: 205 mg/dL — ABNORMAL HIGH (ref <200)
HDL Chol Piccolo, Waived: 65 mg/dL (ref >59)
LDL Chol Calc Piccolo Waived: 110 mg/dL — ABNORMAL HIGH (ref <100)
Triglycerides Piccolo,Waived: 149 mg/dL (ref <150)
VLDL Chol Calc Piccolo,Waive: 30 mg/dL — ABNORMAL HIGH (ref <30)

## 2016-05-02 MED ORDER — VALSARTAN 160 MG PO TABS: 160.0000 mg | ORAL_TABLET | Freq: Every day | ORAL | 1 refills | Status: DC

## 2016-05-02 NOTE — Assessment & Plan Note (Signed)
Not ready to quit.  

## 2016-05-02 NOTE — Assessment & Plan Note (Signed)
Improved LDL and HDL.  Will continue present treatment

## 2016-05-02 NOTE — Progress Notes (Signed)
BP (!) 160/100   Pulse 78   Temp 98.5 F (36.9 C)   Ht 6' 1.2" (1.859 m)   Wt 231 lb 9.6 oz (105.1 kg)   SpO2 95%   BMI 30.39 kg/m    Subjective:    Patient ID: James Davidson, male    DOB: [birth year], 66 y.o.   MRN: [id number]  HPI: James Davidson is a 66 y.o. male  Chief Complaint  Patient presents with  . Hyperlipidemia  . Hypertension   Hypertension Using medications without difficulty Average home BPs Not checking   No problems or lightheadedness No chest pain with exertion or shortness of breath No Edema   Hyperlipidemia Pt trying to use lifestyle for cholesterol No Muscle aches  Exercise: Walking daily Diet: Eating more fruit   Relevant past medical, surgical, family and social history reviewed and updated as indicated. Interim medical history since our last visit reviewed. Allergies and medications reviewed and updated.  Review of Systems  Per HPI unless specifically indicated above     Objective:    BP (!) 160/100   Pulse 78   Temp 98.5 F (36.9 C)   Ht 6' 1.2" (1.859 m)   Wt 231 lb 9.6 oz (105.1 kg)   SpO2 95%   BMI 30.39 kg/m   Wt Readings from Last 3 Encounters:  05/02/16 231 lb 9.6 oz (105.1 kg)  01/28/16 231 lb 12.8 oz (105.1 kg)  12/31/15 227 lb 6.4 oz (103.1 kg)    Physical Exam  Constitutional: He is oriented to person, place, and time. He appears well-developed and well-nourished. No distress.  HENT:  Head: Normocephalic and atraumatic.  Eyes: Conjunctivae and lids are normal. Right eye exhibits no discharge. Left eye exhibits no discharge. No scleral icterus.  Neck: Normal range of motion. Neck supple. No JVD present. Carotid bruit is not present.  Cardiovascular: Normal rate, regular rhythm and normal heart sounds.   Pulmonary/Chest: Effort normal and breath sounds normal. No respiratory distress.  Abdominal: Normal appearance. There is no splenomegaly or hepatomegaly.  Musculoskeletal: Normal range of motion.  Neurological:  He is alert and oriented to person, place, and time.  Skin: Skin is warm, dry and intact. No rash noted. No pallor.  Psychiatric: He has a normal mood and affect. His behavior is normal. Judgment and thought content normal.    Results for orders placed or performed in visit on 12/31/15  Comprehensive metabolic panel  Result Value Ref Range   Glucose 104 (H) 65 - 99 mg/dL   BUN 17 8 - 27 mg/dL   Creatinine, Ser 4.09 0.76 - 1.27 mg/dL   GFR calc non Af Amer 64 >59 mL/min/1.73   GFR calc Af Amer 74 >59 mL/min/1.73   BUN/Creatinine Ratio 14 10 - 24   Sodium 140 134 - 144 mmol/L   Potassium 4.2 3.5 - 5.2 mmol/L   Chloride 104 96 - 106 mmol/L   CO2 20 18 - 29 mmol/L   Calcium 9.4 8.6 - 10.2 mg/dL   Total Protein 6.8 6.0 - 8.5 g/dL   Albumin 4.1 3.6 - 4.8 g/dL   Globulin, Total 2.7 1.5 - 4.5 g/dL   Albumin/Globulin Ratio 1.5 1.2 - 2.2   Bilirubin Total 0.3 0.0 - 1.2 mg/dL   Alkaline Phosphatase 101 39 - 117 IU/L   AST 27 0 - 40 IU/L   ALT 21 0 - 44 IU/L  Lipid Panel w/o Chol/HDL Ratio  Result Value Ref Range   Cholesterol, Total 222 (  H) 100 - 199 mg/dL   Triglycerides 784139 0 - 149 mg/dL   HDL 53 >69>39 mg/dL   VLDL Cholesterol Cal 28 5 - 40 mg/dL   LDL Calculated 629141 (H) 0 - 99 mg/dL      Assessment & Plan:   Problem List Items Addressed This Visit      Unprioritized   Hypercholesteremia - Primary    Improved LDL and HDL.  Will continue present treatment      Relevant Medications   valsartan (DIOVAN) 160 MG tablet   Hypertension    Not to goal.  Increase Diovan to 160 mg.       Relevant Medications   valsartan (DIOVAN) 160 MG tablet   Tobacco use    Not ready to quit          Follow up plan: Return in about 4 weeks (around 05/30/2016).

## 2016-05-02 NOTE — Assessment & Plan Note (Signed)
Not to goal.  Increase Diovan to 160 mg.

## 2016-06-02 ENCOUNTER — Encounter: Payer: Self-pay | Admitting: Unknown Physician Specialty

## 2016-06-02 ENCOUNTER — Ambulatory Visit (INDEPENDENT_AMBULATORY_CARE_PROVIDER_SITE_OTHER): Payer: Medicare Other | Admitting: Unknown Physician Specialty

## 2016-06-02 DIAGNOSIS — I10 Essential (primary) hypertension: Secondary | ICD-10-CM

## 2016-06-02 NOTE — Progress Notes (Signed)
   BP 120/67 (BP Location: Left Arm, Patient Position: Sitting, Cuff Size: Large)   Pulse 85   Temp 97.9 F (36.6 C)   Wt 223 lb (101.2 kg)   SpO2 97%   BMI 29.26 kg/m    Subjective:    Patient ID: James Davidson, male    DOB: 01/12/1951, 66 y.o.   MRN: [id number]  HPI: James Davidson is a 66 y.o. male  Chief Complaint  Patient presents with  . Hypertension    4 week f/up   Hypertension Using medications without difficulty Average home BPs Not checking as recently down with the flu  No problems or lightheadedness No chest pain with exertion or shortness of breath No Edema  Relevant past medical, surgical, family and social history reviewed and updated as indicated. Interim medical history since our last visit reviewed. Allergies and medications reviewed and updated.  Review of Systems  Per HPI unless specifically indicated above     Objective:    BP 120/67 (BP Location: Left Arm, Patient Position: Sitting, Cuff Size: Large)   Pulse 85   Temp 97.9 F (36.6 C)   Wt 223 lb (101.2 kg)   SpO2 97%   BMI 29.26 kg/m   Wt Readings from Last 3 Encounters:  06/02/16 223 lb (101.2 kg)  05/02/16 231 lb 9.6 oz (105.1 kg)  01/28/16 231 lb 12.8 oz (105.1 kg)    Physical Exam  Constitutional: He is oriented to person, place, and time. He appears well-developed and well-nourished. No distress.  HENT:  Head: Normocephalic and atraumatic.  Eyes: Conjunctivae and lids are normal. Right eye exhibits no discharge. Left eye exhibits no discharge. No scleral icterus.  Neck: Normal range of motion. Neck supple. No JVD present. Carotid bruit is not present.  Cardiovascular: Normal rate, regular rhythm and normal heart sounds.   Pulmonary/Chest: Effort normal and breath sounds normal. No respiratory distress.  Abdominal: Normal appearance. There is no splenomegaly or hepatomegaly.  Musculoskeletal: Normal range of motion.  Neurological: He is alert and oriented to person, place, and  time.  Skin: Skin is warm, dry and intact. No rash noted. No pallor.  Psychiatric: He has a normal mood and affect. His behavior is normal. Judgment and thought content normal.    Results for orders placed or performed in visit on 05/02/16  Lipid Panel Piccolo, [employer]  Result Value Ref Range   Cholesterol Piccolo, Waived 205 (H) <200 mg/dL   HDL Chol Piccolo, Waived 65 >59 mg/dL   Triglycerides Piccolo,Waived 149 <150 mg/dL   Chol/HDL Ratio Piccolo,Waive 3.1 mg/dL   LDL Chol Calc Piccolo Waived 110 (H) <100 mg/dL   VLDL Chol Calc Piccolo,Waive 30 (H) <30 mg/dL      Assessment & Plan:   Problem List Items Addressed This Visit      Unprioritized   Hypertension    Stable, continue present medications.            Follow up plan: Return in about 5 months (around 11/02/2016).

## 2016-06-02 NOTE — Assessment & Plan Note (Signed)
Stable, continue present medications.   

## 2016-06-15 ENCOUNTER — Other Ambulatory Visit: Payer: Self-pay | Admitting: Unknown Physician Specialty

## 2016-06-16 NOTE — Telephone Encounter (Signed)
Patient is currently taking the 160 mg and not 80 mg.

## 2016-09-18 ENCOUNTER — Telehealth: Payer: Self-pay

## 2016-09-18 MED ORDER — TELMISARTAN 80 MG PO TABS: 80.0000 mg | ORAL_TABLET | Freq: Every day | ORAL | 1 refills | Status: DC

## 2016-09-18 NOTE — Telephone Encounter (Signed)
Pharmacy sent a fax stating that valsartan has been recalled and want to know if we would like to change this patient to a different medication. Please advise.

## 2016-09-18 NOTE — Telephone Encounter (Signed)
Called and let patient know about medication change. Patient has f/up scheduled for 11/03/16.

## 2016-09-18 NOTE — Telephone Encounter (Signed)
Will need to monitor BP.

## 2016-09-20 ENCOUNTER — Telehealth: Payer: Self-pay | Admitting: Unknown Physician Specialty

## 2016-11-02 ENCOUNTER — Other Ambulatory Visit: Payer: Self-pay | Admitting: Unknown Physician Specialty

## 2016-11-03 ENCOUNTER — Ambulatory Visit (INDEPENDENT_AMBULATORY_CARE_PROVIDER_SITE_OTHER): Payer: Medicare Other | Admitting: Unknown Physician Specialty

## 2016-11-03 ENCOUNTER — Encounter: Payer: Self-pay | Admitting: Unknown Physician Specialty

## 2016-11-03 VITALS — BP 145/86 | HR 75 | Temp 98.6°F | Ht 72.0 in | Wt 219.8 lb

## 2016-11-03 DIAGNOSIS — K219 Gastro-esophageal reflux disease without esophagitis: Secondary | ICD-10-CM | POA: Diagnosis not present

## 2016-11-03 DIAGNOSIS — I10 Essential (primary) hypertension: Secondary | ICD-10-CM

## 2016-11-03 DIAGNOSIS — Z720 Tobacco use: Secondary | ICD-10-CM | POA: Diagnosis not present

## 2016-11-03 DIAGNOSIS — Z0000 Encounter for general adult medical examination without abnormal findings: Secondary | ICD-10-CM | POA: Diagnosis not present

## 2016-11-03 DIAGNOSIS — R351 Nocturia: Secondary | ICD-10-CM | POA: Diagnosis not present

## 2016-11-03 DIAGNOSIS — Z7189 Other specified counseling: Secondary | ICD-10-CM | POA: Diagnosis not present

## 2016-11-03 MED ORDER — TELMISARTAN-HCTZ 80-12.5 MG PO TABS: 1.0000 | ORAL_TABLET | Freq: Every day | ORAL | 1 refills | Status: DC

## 2016-11-03 NOTE — Assessment & Plan Note (Signed)
Stable, continue present medications.   

## 2016-11-03 NOTE — Assessment & Plan Note (Signed)
I have recommended absolute tobacco cessation. I have discussed various options available for assistance with tobacco cessation including over the counter methods (Nicotine gum, patch and lozenges). We also discussed prescription options (Chantix, Nicotine Inhaler / Nasal Spray). The patient is not interested in pursuing any prescription tobacco cessation options at this time.  

## 2016-11-03 NOTE — Assessment & Plan Note (Signed)
Not to goal.  Add HCTZ 12.5 mg 

## 2016-11-03 NOTE — Progress Notes (Signed)
BP (!) 145/86 (BP Location: Left Arm, Cuff Size: Normal)   Pulse 75   Temp 98.6 F (37 C)   Ht 6' (1.829 m)   Wt 219 lb 12.8 oz (99.7 kg)   SpO2 96%   BMI 29.81 kg/m    Subjective:    Patient ID: James Davidson, male    DOB: 24-Sep-1950, 66 y.o.   MRN: [id number]  HPI: James Davidson is a 66 y.o. male  Chief Complaint  Patient presents with  . Hypertension  . Gastroesophageal Reflux   Hypertension Using medications without difficulty Average home BPs: not checking   No problems or lightheadedness No chest pain with exertion or shortness of breath No Edema  GERD Doing well with current medications  Tobacco Smoking less.  Not ready to quit yet.  Not eligible for CT screening due to not smoking enough  Functional Status Survey: Is the patient deaf or have difficulty hearing?: No Does the patient have difficulty seeing, even when wearing glasses/contacts?: No Does the patient have difficulty concentrating, remembering, or making decisions?: No Does the patient have difficulty walking or climbing stairs?: No Does the patient have difficulty dressing or bathing?: No Does the patient have difficulty doing errands alone such as visiting a doctor's office or shopping?: No Fall Risk  11/03/2016 08/23/2015  Falls in the past year? No No   Depression screen [facility] 2/9 11/03/2016 08/23/2015  Decreased Interest 0 0  Down, Depressed, Hopeless 0 0  PHQ - 2 Score 0 0  Altered sleeping 0 -  Tired, decreased energy 0 -  Change in appetite 0 -  Feeling bad or failure about yourself  0 -  Trouble concentrating 0 -  Moving slowly or fidgety/restless 0 -  Suicidal thoughts 0 -  PHQ-9 Score 0 -   Social History   Social History  . Marital status: Married    Spouse name: N/A  . Number of children: N/A  . Years of education: N/A   Occupational History  . Not on file.   Social History Main Topics  . Smoking status: Current Every Day Smoker    Packs/day: 0.25    Types: Cigarettes    . Smokeless tobacco: Never Used  . Alcohol use 0.0 oz/week     Comment: on occasion  . Drug use: No  . Sexual activity: Yes   Other Topics Concern  . Not on file   Social History Narrative  . No narrative on file   Past Medical History:  Diagnosis Date  . Hyperlipidemia   . Hypertension   . Tobacco use    Past Surgical History:  Procedure Laterality Date  . EXTERNAL EAR SURGERY Left 12/2014   growth removal  . VOCAL CORD LATERALIZATION, ENDOSCOPIC APPROACH W/ MLB  1980   Family History  Problem Relation Age of Onset  . Cancer Mother   . Hypertension Mother   . Emphysema Father   . Lung disease Father     Relevant past medical, surgical, family and social history reviewed and updated as indicated. Interim medical history since our last visit reviewed. Allergies and medications reviewed and updated.  Review of Systems  Constitutional: Negative.   HENT: Negative.   Eyes: Negative.   Respiratory: Negative.   Gastrointestinal: Negative.   Endocrine: Negative.   Genitourinary:       Wakes up to use the bathroom once or twice  Musculoskeletal: Negative.   Allergic/Immunologic: Negative.   Neurological: Negative.   Hematological: Negative.   Psychiatric/Behavioral:  Negative.     Per HPI unless specifically indicated above     Objective:    BP (!) 145/86 (BP Location: Left Arm, Cuff Size: Normal)   Pulse 75   Temp 98.6 F (37 C)   Ht 6' (1.829 m)   Wt 219 lb 12.8 oz (99.7 kg)   SpO2 96%   BMI 29.81 kg/m   Wt Readings from Last 3 Encounters:  11/03/16 219 lb 12.8 oz (99.7 kg)  06/02/16 223 lb (101.2 kg)  05/02/16 231 lb 9.6 oz (105.1 kg)    Physical Exam  Constitutional: He is oriented to person, place, and time. He appears well-developed and well-nourished.  HENT:  Head: Normocephalic.  Right Ear: Tympanic membrane, external ear and ear canal normal.  Left Ear: Tympanic membrane, external ear and ear canal normal.  Mouth/Throat: Uvula is midline,  oropharynx is clear and moist and mucous membranes are normal.  Eyes: Pupils are equal, round, and reactive to light.  Cardiovascular: Normal rate, regular rhythm and normal heart sounds.  Exam reveals no gallop and no friction rub.   No murmur heard. Pulmonary/Chest: Effort normal and breath sounds normal. No respiratory distress.  Abdominal: Soft. Bowel sounds are normal. He exhibits no distension. There is no tenderness.  Musculoskeletal: Normal range of motion.  Neurological: He is alert and oriented to person, place, and time. He has normal reflexes.  Skin: Skin is warm and dry.  Psychiatric: He has a normal mood and affect. His behavior is normal. Judgment and thought content normal.    Results for orders placed or performed in visit on 05/02/16  Lipid Panel Piccol[employer], [employer]  Result Value Ref Range   Cholesterol Piccolo, Waived 205 (H) <200 mg/dL   HDL Chol Piccolo, Waived 65 >59 mg/dL   Triglycerides Piccolo,Waived 149 <150 mg/dL   Chol/HDL Ratio Piccolo,Waive 3.1 mg/dL   LDL Chol Calc Piccolo Waived 110 (H) <100 mg/dL   VLDL Chol Calc Piccolo,Waive 30 (H) <30 mg/dL      Assessment & Plan:   Problem List Items Addressed This Visit      Unprioritized   Advance care planning    A voluntary discussion about advance care planning including the explanation and discussion of advance directives was extensively discussed  with the patient.  Explanation about the health care proxy and Living will was reviewed and packet with forms with explanation of how to fill them out was given.  During this discussion, the patient was able to identify a health care proxy as his daughter. Planning visit for further assistance with forms.         GERD (gastroesophageal reflux disease)    Stable, continue present medications.        Hypertension    Not to goal.  Add HCTZ 12.5 mg      Relevant Medications   telmisartan-hydrochlorothiazide (MICARDIS HCT) 80-12.5 MG tablet   Tobacco use     I  have recommended absolute tobacco cessation. I have discussed various options available for assistance with tobacco cessation including over the counter methods (Nicotine gum, patch and lozenges). We also discussed prescription options (Chantix, Nicotine Inhaler / Nasal Spray). The patient is not interested in pursuing any prescription tobacco cessation options at this time.        Other Visit Diagnoses    Annual physical exam    -  Primary       Follow up plan: Return in about 4 weeks (around 12/01/2016).

## 2016-11-03 NOTE — Assessment & Plan Note (Signed)
A voluntary discussion about advance care planning including the explanation and discussion of advance directives was extensively discussed  with the patient.  Explanation about the health care proxy and Living will was reviewed and packet with forms with explanation of how to fill them out was given.  During this discussion, the patient was able to identify a health care proxy as his daughter. Planning visit for further assistance with forms.

## 2016-11-04 LAB — LIPID PANEL W/O CHOL/HDL RATIO
Cholesterol, Total: 172 mg/dL (ref 100–199)
HDL: 66 mg/dL (ref >39)
LDL Calculated: 83 mg/dL (ref 0–99)
Triglycerides: 113 mg/dL (ref 0–149)
VLDL Cholesterol Cal: 23 mg/dL (ref 5–40)

## 2016-11-04 LAB — COMPREHENSIVE METABOLIC PANEL
A/G RATIO: 1.8 (ref 1.2–2.2)
ALT: 16 IU/L (ref 0–44)
AST: 28 IU/L (ref 0–40)
Albumin: 4.4 g/dL (ref 3.6–4.8)
Alkaline Phosphatase: 137 IU/L — ABNORMAL HIGH (ref 39–117)
BILIRUBIN TOTAL: 0.3 mg/dL (ref 0.0–1.2)
BUN / CREAT RATIO: 14 (ref 10–24)
BUN: 16 mg/dL (ref 8–27)
CHLORIDE: 103 mmol/L (ref 96–106)
CO2: 21 mmol/L (ref 20–29)
Calcium: 9.4 mg/dL (ref 8.6–10.2)
Creatinine, Ser: 1.14 mg/dL (ref 0.76–1.27)
GFR, EST AFRICAN AMERICAN: 78 mL/min/{1.73_m2} (ref >59)
GFR, EST NON AFRICAN AMERICAN: 67 mL/min/{1.73_m2} (ref >59)
GLUCOSE: 104 mg/dL — AB (ref 65–99)
Globulin, Total: 2.5 g/dL (ref 1.5–4.5)
POTASSIUM: 4.4 mmol/L (ref 3.5–5.2)
SODIUM: 140 mmol/L (ref 134–144)
Total Protein: 6.9 g/dL (ref 6.0–8.5)

## 2016-11-04 LAB — PSA: PROSTATE SPECIFIC AG, SERUM: 0.5 ng/mL (ref 0.0–4.0)

## 2016-11-04 LAB — TSH: TSH: 0.774 u[IU]/mL (ref 0.450–4.500)

## 2016-11-06 ENCOUNTER — Encounter: Payer: Self-pay | Admitting: Unknown Physician Specialty

## 2016-11-07 ENCOUNTER — Telehealth: Payer: Self-pay

## 2016-11-07 MED ORDER — HYDROCHLOROTHIAZIDE 12.5 MG PO CAPS: 12.5000 mg | ORAL_CAPSULE | Freq: Every day | ORAL | 3 refills | Status: DC

## 2016-11-07 NOTE — Telephone Encounter (Signed)
Pharmacy sent a fax stating "Patient already has plenty of telmisartan 80 mg- combo product very $$- may we just add an RX for HCTZ 12.5 mg?" [place] [address] [place] [address] [place]?

## 2016-12-01 ENCOUNTER — Ambulatory Visit (INDEPENDENT_AMBULATORY_CARE_PROVIDER_SITE_OTHER): Payer: Medicare Other | Admitting: Unknown Physician Specialty

## 2016-12-01 ENCOUNTER — Encounter: Payer: Self-pay | Admitting: Unknown Physician Specialty

## 2016-12-01 DIAGNOSIS — I10 Essential (primary) hypertension: Secondary | ICD-10-CM

## 2016-12-01 MED ORDER — TELMISARTAN 80 MG PO TABS: 80.0000 mg | ORAL_TABLET | Freq: Every day | ORAL | 1 refills | Status: DC

## 2016-12-01 NOTE — Assessment & Plan Note (Signed)
Stable, continue present medications.   

## 2016-12-01 NOTE — Progress Notes (Signed)
BP 116/76   Pulse 81   Temp 97.8 F (36.6 C)   Wt 221 lb 9.6 oz (100.5 kg) Comment: pt had shoes on  SpO2 96%   BMI 30.05 kg/m    Subjective:    Patient ID: James Davidson, male    DOB: 1950-04-16, 66 y.o.   MRN: [id number]  HPI: James Davidson is a 66 y.o. male  Chief Complaint  Patient presents with  . Hypertension    4 week f/up    Hypertension Added HCTZ last visit.  Using medications without difficulty Average home BPs   No problems or lightheadedness No chest pain with exertion or shortness of breath No Edema   Relevant past medical, surgical, family and social history reviewed and updated as indicated. Interim medical history since our last visit reviewed. Allergies and medications reviewed and updated.  Review of Systems  Per HPI unless specifically indicated above     Objective:    BP 116/76   Pulse 81   Temp 97.8 F (36.6 C)   Wt 221 lb 9.6 oz (100.5 kg) Comment: pt had shoes on  SpO2 96%   BMI 30.05 kg/m   Wt Readings from Last 3 Encounters:  12/01/16 221 lb 9.6 oz (100.5 kg)  11/03/16 219 lb 12.8 oz (99.7 kg)  06/02/16 223 lb (101.2 kg)    Physical Exam  Constitutional: He is oriented to person, place, and time. He appears well-developed and well-nourished. No distress.  HENT:  Head: Normocephalic and atraumatic.  Eyes: Conjunctivae and lids are normal. Right eye exhibits no discharge. Left eye exhibits no discharge. No scleral icterus.  Neck: Normal range of motion. Neck supple. No JVD present. Carotid bruit is not present.  Cardiovascular: Normal rate, regular rhythm and normal heart sounds.   Pulmonary/Chest: Effort normal and breath sounds normal. No respiratory distress.  Abdominal: Normal appearance. There is no splenomegaly or hepatomegaly.  Musculoskeletal: Normal range of motion.  Neurological: He is alert and oriented to person, place, and time.  Skin: Skin is warm, dry and intact. No rash noted. No pallor.  Psychiatric: He has a  normal mood and affect. His behavior is normal. Judgment and thought content normal.    Results for orders placed or performed in visit on 11/03/16  Comprehensive metabolic panel  Result Value Ref Range   Glucose 104 (H) 65 - 99 mg/dL   BUN 16 8 - 27 mg/dL   Creatinine, Ser 4.69 0.76 - 1.27 mg/dL   GFR calc non Af Amer 67 >59 mL/min/1.73   GFR calc Af Amer 78 >59 mL/min/1.73   BUN/Creatinine Ratio 14 10 - 24   Sodium 140 134 - 144 mmol/L   Potassium 4.4 3.5 - 5.2 mmol/L   Chloride 103 96 - 106 mmol/L   CO2 21 20 - 29 mmol/L   Calcium 9.4 8.6 - 10.2 mg/dL   Total Protein 6.9 6.0 - 8.5 g/dL   Albumin 4.4 3.6 - 4.8 g/dL   Globulin, Total 2.5 1.5 - 4.5 g/dL   Albumin/Globulin Ratio 1.8 1.2 - 2.2   Bilirubin Total 0.3 0.0 - 1.2 mg/dL   Alkaline Phosphatase 137 (H) 39 - 117 IU/L   AST 28 0 - 40 IU/L   ALT 16 0 - 44 IU/L  Lipid Panel w/o Chol/HDL Ratio  Result Value Ref Range   Cholesterol, Total 172 100 - 199 mg/dL   Triglycerides 629 0 - 149 mg/dL   HDL 66 >52 mg/dL   VLDL Cholesterol Cal  23 5 - 40 mg/dL   LDL Calculated 83 0 - 99 mg/dL  TSH  Result Value Ref Range   TSH 0.774 0.450 - 4.500 uIU/mL  PSA  Result Value Ref Range   Prostate Specific Ag, Serum 0.5 0.0 - 4.0 ng/mL      Assessment & Plan:   Problem List Items Addressed This Visit      Unprioritized   Hypertension    Stable, continue present medications.        Relevant Medications   telmisartan (MICARDIS) 80 MG tablet       Follow up plan: Return in about 6 months (around 06/01/2017).

## 2017-01-03 ENCOUNTER — Other Ambulatory Visit: Payer: Self-pay

## 2017-01-03 MED ORDER — OMEPRAZOLE 20 MG PO CPDR: 20.0000 mg | DELAYED_RELEASE_CAPSULE | Freq: Every day | ORAL | 12 refills | Status: AC

## 2017-01-03 NOTE — Telephone Encounter (Signed)
Patient last seen 12/01/16 and has f/up in 05/2017. Can we add refills to this medication for the patient please?

## 2017-02-02 IMAGING — CT CT MAXILLOFACIAL W/ CM
1 series · 15 of 30 positions shown, 19 images · IV contrast (omnipaque)
Comparison: None.

CLINICAL DATA: Mass of ear auricle, left. Present for 3 years but
enlarging.

EXAM:
CT MAXILLOFACIAL WITH CONTRAST
TECHNIQUE: Multidetector CT imaging of the maxillofacial structures was
performed with intravenous contrast. Multiplanar CT image
reconstructions were also generated. A small metallic BB was placed
on the right temple in order to reliably differentiate right from
left.
CONTRAST:  75mL OMNIPAQUE IOHEXOL 300 MG/ML  SOLN

[Series 2: max soft · axial · 0.43mm/px · z∈[-82,+98]mm · 15 of 98 slices shown, 19 images]
[im 4/98  brain]
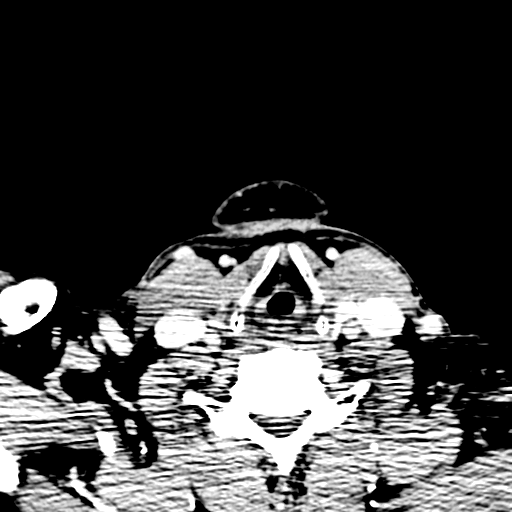
[im 4/98  bone]
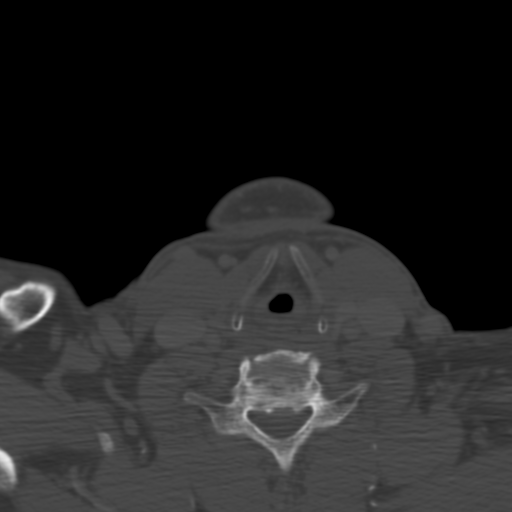
[im 11/98  bone]
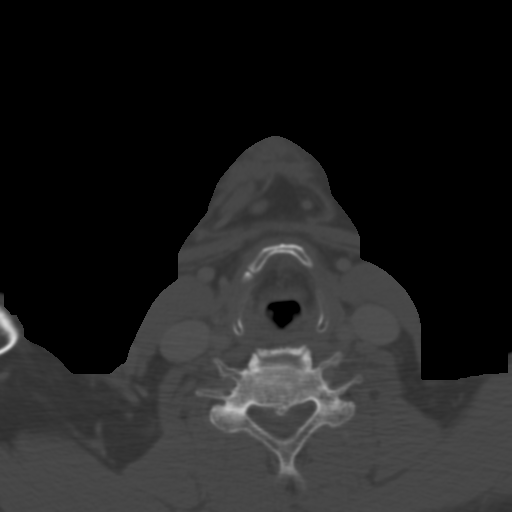
[im 17/98  bone]
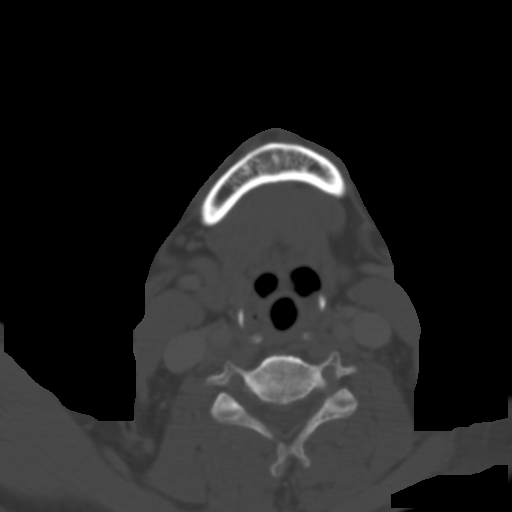
[im 24/98  bone]
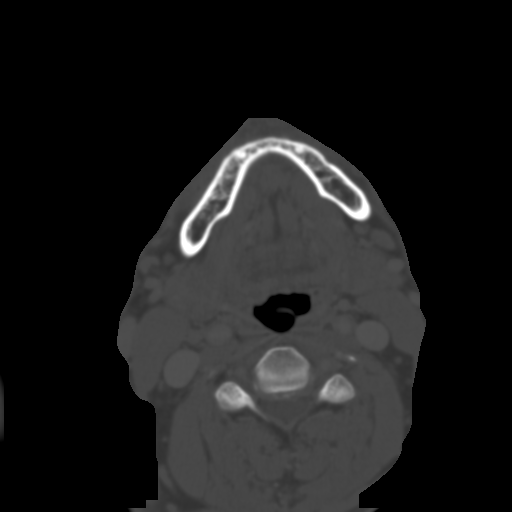
[im 31/98  brain]
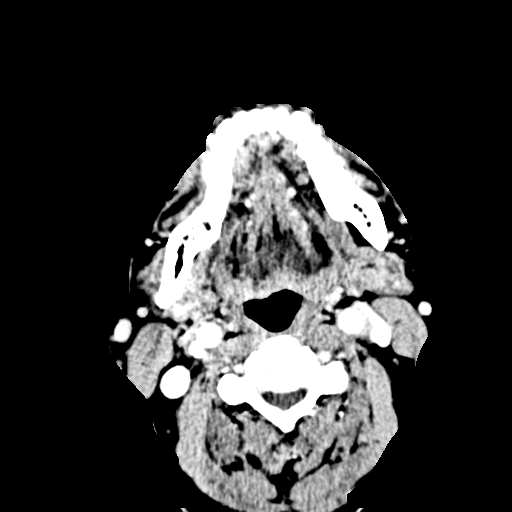
[im 31/98  bone]
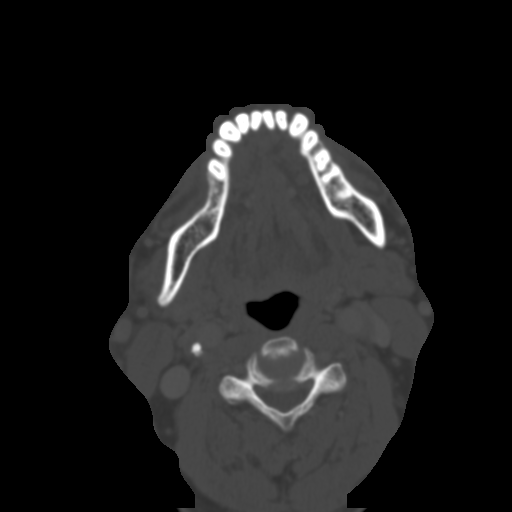
[im 37/98  bone]
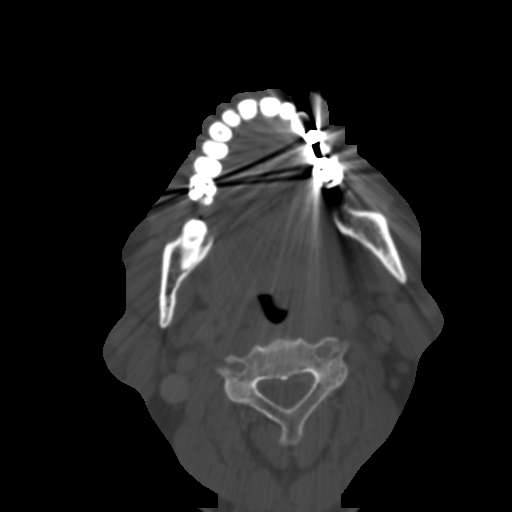
[im 44/98  bone]
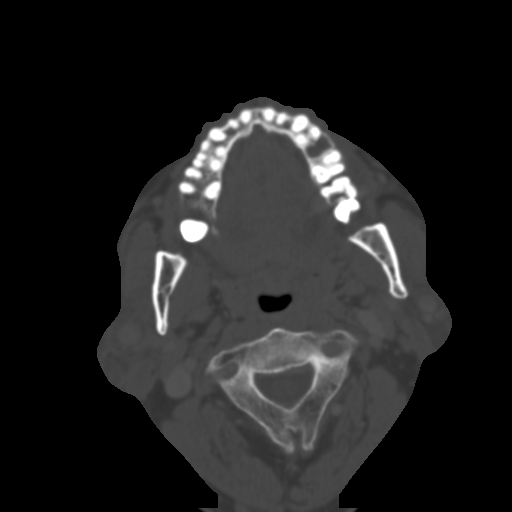
[im 51/98  bone]
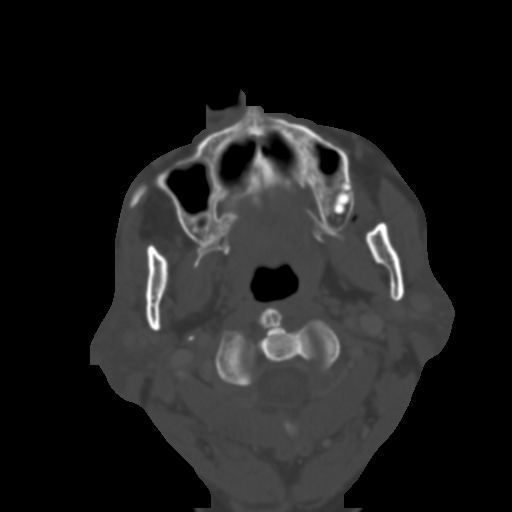
[im 54/98  brain]
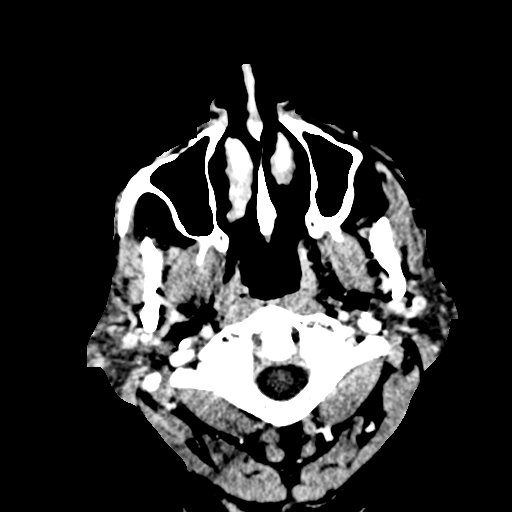
[im 54/98  bone]
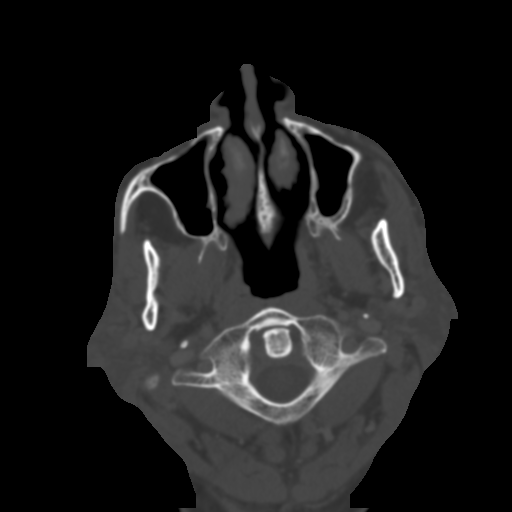
[im 61/98  bone]
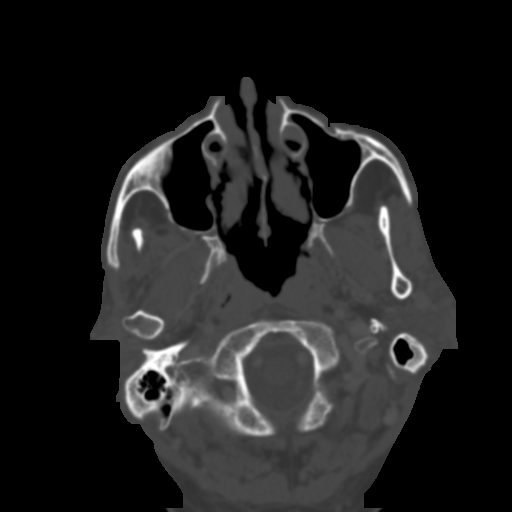
[im 67/98  bone]
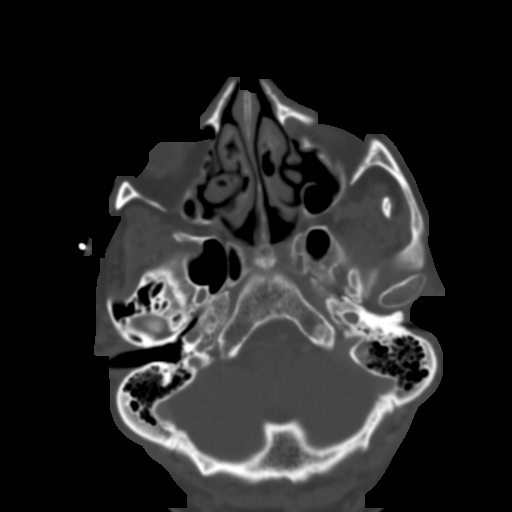
[im 74/98  bone]
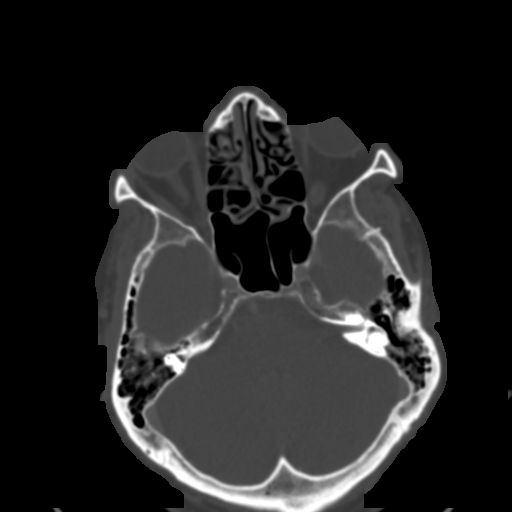
[im 81/98  brain]
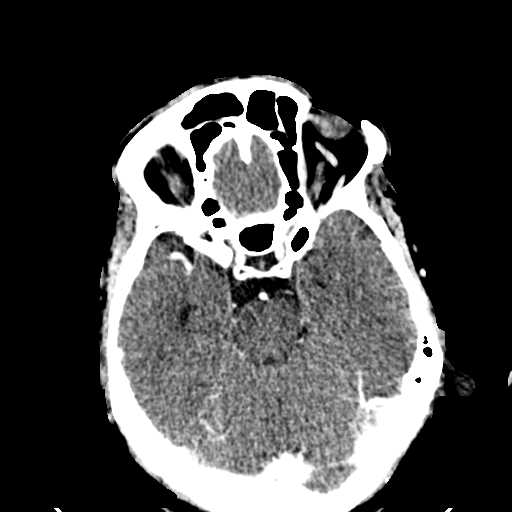
[im 81/98  bone]
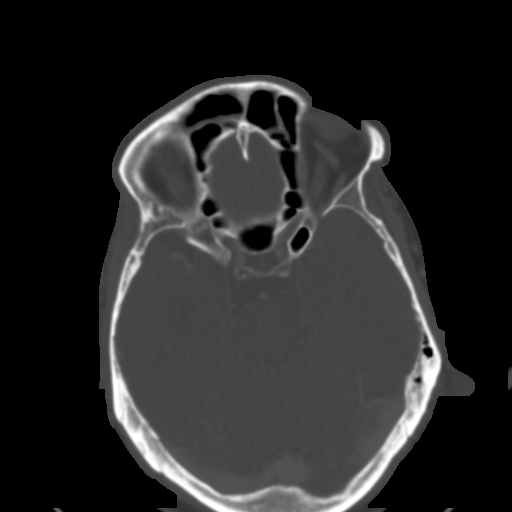
[im 87/98  bone]
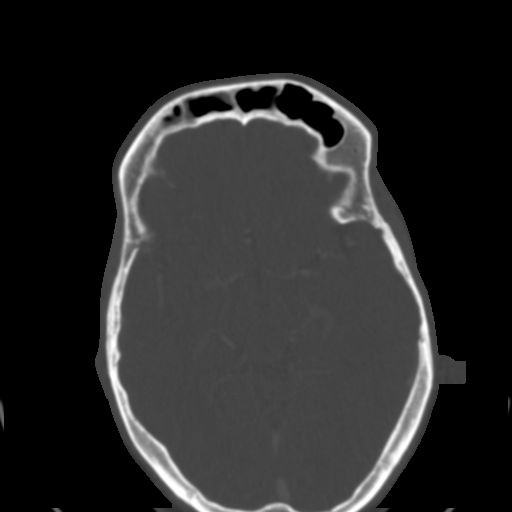
[im 94/98  bone]
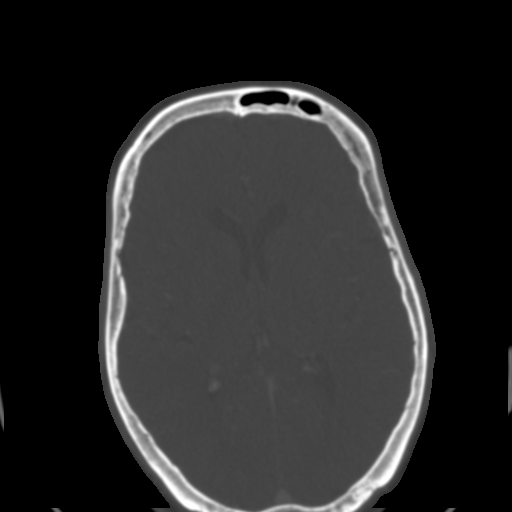

[15 of 30 positions shown; findings below may reference images not displayed]

FINDINGS: Cystic mass lesion is present behind the left auricle. This has
slightly heterogeneous density measuring slightly above that of
water. This may be a complex cyst or could be a sebaceous cyst. No
evidence of inflammation. No enhancement of the wall or surrounding
edema. The mass measures 20 x 35 mm and appears well-defined and is
not invading deep structures. No bony erosion.

Limited intracranial imaging negative.

The pharynx appears normal. No pharyngeal mass. No regional
adenopathy. Parotid and submandibular glands appear normal. Normal
orbit.

Chronic sinusitis with mucosal edema in the paranasal sinuses as
well as bony thickening. No air-fluid level.

Disc degeneration and spondylosis of a moderate to advanced degree.
This is most severe at C5-6 and C6-7 where there is significant
spinal stenosis.
IMPRESSION: Palpable abnormality corresponds to a cystic mass behind the left
ear. Slightly heterogeneous density raises the possibility of
sebaceous cyst. This is not appear to be actively infected and is
not likely to represent a neoplastic mass

Chronic sinusitis.

## 2017-05-02 ENCOUNTER — Other Ambulatory Visit: Payer: Self-pay | Admitting: Unknown Physician Specialty

## 2017-06-01 ENCOUNTER — Ambulatory Visit (INDEPENDENT_AMBULATORY_CARE_PROVIDER_SITE_OTHER): Payer: Medicare Other | Admitting: Unknown Physician Specialty

## 2017-06-01 ENCOUNTER — Encounter: Payer: Self-pay | Admitting: Unknown Physician Specialty

## 2017-06-01 VITALS — BP 169/98 | HR 74 | Temp 98.4°F | Ht 72.0 in | Wt 226.1 lb

## 2017-06-01 DIAGNOSIS — M25471 Effusion, right ankle: Secondary | ICD-10-CM

## 2017-06-01 DIAGNOSIS — I10 Essential (primary) hypertension: Secondary | ICD-10-CM | POA: Diagnosis not present

## 2017-06-01 DIAGNOSIS — R748 Abnormal levels of other serum enzymes: Secondary | ICD-10-CM | POA: Diagnosis not present

## 2017-06-01 DIAGNOSIS — Z720 Tobacco use: Secondary | ICD-10-CM | POA: Diagnosis not present

## 2017-06-01 NOTE — Progress Notes (Signed)
BP (!) 169/98 (BP Location: Left Arm, Cuff Size: Normal)   Pulse 74   Temp 98.4 F (36.9 C) (Oral)   Ht 6' (1.829 m)   Wt 226 lb 1.6 oz (102.6 kg)   SpO2 97%   BMI 30.66 kg/m    Subjective:    Patient ID: James Davidson, male    DOB: 03-14-1950, 67 y.o.   MRN: [id number]  HPI: James Davidson is a 67 y.o. male  Chief Complaint  Patient presents with  . Hypertension    6 month f/up   Hypertension Stopped the HCTZ as unable to get it filled.  We filled it 3/6 but pt states he hasn't been back  Average home BPs Not checking   No problems or lightheadedness No chest pain with exertion or shortness of breath No Edema  End of February starte having pain right hip and ankle.  Hip improved but ankle problem is new.  States it is gradually better and gone after 24 hours.  Wonders if he had gout. Swollen off and on since then.    Relevant past medical, surgical, family and social history reviewed and updated as indicated. Interim medical history since our last visit reviewed. Allergies and medications reviewed and updated.  Review of Systems  Constitutional: Negative.   Respiratory: Negative.   Cardiovascular: Negative.   Neurological: Negative.   Psychiatric/Behavioral: Negative.     Per HPI unless specifically indicated above     Objective:    BP (!) 169/98 (BP Location: Left Arm, Cuff Size: Normal)   Pulse 74   Temp 98.4 F (36.9 C) (Oral)   Ht 6' (1.829 m)   Wt 226 lb 1.6 oz (102.6 kg)   SpO2 97%   BMI 30.66 kg/m   Wt Readings from Last 3 Encounters:  06/01/17 226 lb 1.6 oz (102.6 kg)  12/01/16 221 lb 9.6 oz (100.5 kg)  11/03/16 219 lb 12.8 oz (99.7 kg)    Physical Exam  Constitutional: He is oriented to person, place, and time. He appears well-developed and well-nourished. No distress.  HENT:  Head: Normocephalic and atraumatic.  Eyes: Conjunctivae and lids are normal. Right eye exhibits no discharge. Left eye exhibits no discharge. No scleral icterus.    Neck: Normal range of motion. Neck supple. No JVD present. Carotid bruit is not present.  Cardiovascular: Normal rate, regular rhythm and normal heart sounds.  Pulmonary/Chest: Effort normal and breath sounds normal. No respiratory distress.  Abdominal: Normal appearance. There is no splenomegaly or hepatomegaly.  Musculoskeletal: Normal range of motion.  Neurological: He is alert and oriented to person, place, and time.  Skin: Skin is warm, dry and intact. No rash noted. No pallor.  Psychiatric: He has a normal mood and affect. His behavior is normal. Judgment and thought content normal.    Results for orders placed or performed in visit on 11/03/16  Comprehensive metabolic panel  Result Value Ref Range   Glucose 104 (H) 65 - 99 mg/dL   BUN 16 8 - 27 mg/dL   Creatinine, Ser 1.14 0.76 - 1.27 mg/dL   GFR calc non Af Amer 67 >59 mL/min/1.73   GFR calc Af Amer 78 >59 mL/min/1.73   BUN/Creatinine Ratio 14 10 - 24   Sodium 140 134 - 144 mmol/L   Potassium 4.4 3.5 - 5.2 mmol/L   Chloride 103 96 - 106 mmol/L   CO2 21 20 - 29 mmol/L   Calcium 9.4 8.6 - 10.2 mg/dL   Total Protein 6.9  6.0 - 8.5 g/dL   Albumin 4.4 3.6 - 4.8 g/dL   Globulin, Total 2.5 1.5 - 4.5 g/dL   Albumin/Globulin Ratio 1.8 1.2 - 2.2   Bilirubin Total 0.3 0.0 - 1.2 mg/dL   Alkaline Phosphatase 137 (H) 39 - 117 IU/L   AST 28 0 - 40 IU/L   ALT 16 0 - 44 IU/L  Lipid Panel w/o Chol/HDL Ratio  Result Value Ref Range   Cholesterol, Total 172 100 - 199 mg/dL   Triglycerides 113 0 - 149 mg/dL   HDL 66 >39 mg/dL   VLDL Cholesterol Cal 23 5 - 40 mg/dL   LDL Calculated 83 0 - 99 mg/dL  TSH  Result Value Ref Range   TSH 0.774 0.450 - 4.500 uIU/mL  PSA  Result Value Ref Range   Prostate Specific Ag, Serum 0.5 0.0 - 4.0 ng/mL      Assessment & Plan:   Problem List Items Addressed This Visit      Unprioritized   Ankle swelling, right    New problem.  ? Gout.  Check uric acid levels.        Elevated alkaline  phosphatase level    Chart review shows elevated Alk Phos.  Recheck levels today.        Hypertension - Primary    Not to goal.  Restart HCTZ 25 mg.  Check uric acid levels and may need to change.        Relevant Orders   Comprehensive metabolic panel   Uric acid   Tobacco use    Not a candidate for low dose CT as he doesn't smoke enough to quality          Follow up plan: Return in about 1 month (around 06/29/2017).

## 2017-06-01 NOTE — Assessment & Plan Note (Signed)
Not to goal.  Restart HCTZ 25 mg.  Check uric acid levels and may need to change.

## 2017-06-01 NOTE — Assessment & Plan Note (Signed)
Chart review shows elevated Alk Phos.  Recheck levels today.

## 2017-06-01 NOTE — Assessment & Plan Note (Signed)
Not a candidate for low dose CT as he doesn't smoke enough to quality

## 2017-06-01 NOTE — Assessment & Plan Note (Addendum)
New problem.  ? Gout.  Check uric acid levels.

## 2017-06-02 LAB — COMPREHENSIVE METABOLIC PANEL
ALT: 17 IU/L (ref 0–44)
AST: 31 IU/L (ref 0–40)
Albumin/Globulin Ratio: 1.8 (ref 1.2–2.2)
Albumin: 4.2 g/dL (ref 3.6–4.8)
Alkaline Phosphatase: 103 IU/L (ref 39–117)
BUN/Creatinine Ratio: 12 (ref 10–24)
BUN: 13 mg/dL (ref 8–27)
Bilirubin Total: 0.6 mg/dL (ref 0.0–1.2)
CALCIUM: 9.1 mg/dL (ref 8.6–10.2)
CO2: 22 mmol/L (ref 20–29)
Chloride: 106 mmol/L (ref 96–106)
Creatinine, Ser: 1.1 mg/dL (ref 0.76–1.27)
GFR, EST AFRICAN AMERICAN: 80 mL/min/{1.73_m2} (ref >59)
GFR, EST NON AFRICAN AMERICAN: 70 mL/min/{1.73_m2} (ref >59)
GLUCOSE: 88 mg/dL (ref 65–99)
Globulin, Total: 2.4 g/dL (ref 1.5–4.5)
Potassium: 4 mmol/L (ref 3.5–5.2)
Sodium: 143 mmol/L (ref 134–144)
TOTAL PROTEIN: 6.6 g/dL (ref 6.0–8.5)

## 2017-06-02 LAB — URIC ACID: Uric Acid: 8.3 mg/dL (ref 3.7–8.6)

## 2017-07-03 ENCOUNTER — Encounter: Payer: Self-pay | Admitting: Unknown Physician Specialty

## 2017-07-03 ENCOUNTER — Ambulatory Visit (INDEPENDENT_AMBULATORY_CARE_PROVIDER_SITE_OTHER): Payer: Medicare Other | Admitting: Unknown Physician Specialty

## 2017-07-03 DIAGNOSIS — K219 Gastro-esophageal reflux disease without esophagitis: Secondary | ICD-10-CM | POA: Diagnosis not present

## 2017-07-03 DIAGNOSIS — I10 Essential (primary) hypertension: Secondary | ICD-10-CM | POA: Diagnosis not present

## 2017-07-03 DIAGNOSIS — M25471 Effusion, right ankle: Secondary | ICD-10-CM

## 2017-07-03 NOTE — Assessment & Plan Note (Signed)
Resolved.  Uric acid upper limits of normal.  Will continue to monitor if symptoms reoccur

## 2017-07-03 NOTE — Progress Notes (Signed)
BP 109/74   Pulse (!) 108   Temp 98.2 F (36.8 C) (Oral)   Ht 6' (1.829 m)   Wt 224 lb 6.4 oz (101.8 kg)   SpO2 94%   BMI 30.43 kg/m    Subjective:    Patient ID: James Davidson, male    DOB: 09-Dec-1950, 67 y.o.   MRN: [id number]  HPI: James Davidson is a 67 y.o. male  Chief Complaint  Patient presents with  . Hypertension    1 month f/up   Hypertension Added HCTZ 12.5 mg last visit.  Using medications without difficulty Average home BPs checks on occasion  No problems or lightheadedness No chest pain with exertion or shortness of breath No Edema  Joint pain Last visit symptoms similar to gout.  Uric acid upper levels of normal.  No more joint symptoms.   GERD Stable on current medication   Relevant past medical, surgical, family and social history reviewed and updated as indicated. Interim medical history since our last visit reviewed. Allergies and medications reviewed and updated.  Review of Systems  Constitutional: Negative.   Respiratory: Negative.   Psychiatric/Behavioral: Negative.     Per HPI unless specifically indicated above     Objective:    BP 109/74   Pulse (!) 108   Temp 98.2 F (36.8 C) (Oral)   Ht 6' (1.829 m)   Wt 224 lb 6.4 oz (101.8 kg)   SpO2 94%   BMI 30.43 kg/m   Wt Readings from Last 3 Encounters:  07/03/17 224 lb 6.4 oz (101.8 kg)  06/01/17 226 lb 1.6 oz (102.6 kg)  12/01/16 221 lb 9.6 oz (100.5 kg)    Physical Exam  Constitutional: He is oriented to person, place, and time. He appears well-developed and well-nourished. No distress.  HENT:  Head: Normocephalic and atraumatic.  Eyes: Conjunctivae and lids are normal. Right eye exhibits no discharge. Left eye exhibits no discharge. No scleral icterus.  Neck: Normal range of motion. Neck supple. No JVD present. Carotid bruit is not present.  Cardiovascular: Normal rate, regular rhythm and normal heart sounds.  Pulmonary/Chest: Effort normal and breath sounds normal. No  respiratory distress.  Abdominal: Normal appearance. There is no splenomegaly or hepatomegaly.  Musculoskeletal: Normal range of motion.  Neurological: He is alert and oriented to person, place, and time.  Skin: Skin is warm, dry and intact. No rash noted. No pallor.  Psychiatric: He has a normal mood and affect. His behavior is normal. Judgment and thought content normal.    Results for orders placed or performed in visit on 06/01/17  Comprehensive metabolic panel  Result Value Ref Range   Glucose 88 65 - 99 mg/dL   BUN 13 8 - 27 mg/dL   Creatinine, Ser 4.09 0.76 - 1.27 mg/dL   GFR calc non Af Amer 70 >59 mL/Davidson/1.73   GFR calc Af Amer 80 >59 mL/Davidson/1.73   BUN/Creatinine Ratio 12 10 - 24   Sodium 143 134 - 144 mmol/L   Potassium 4.0 3.5 - 5.2 mmol/L   Chloride 106 96 - 106 mmol/L   CO2 22 20 - 29 mmol/L   Calcium 9.1 8.6 - 10.2 mg/dL   Total Protein 6.6 6.0 - 8.5 g/dL   Albumin 4.2 3.6 - 4.8 g/dL   Globulin, Total 2.4 1.5 - 4.5 g/dL   Albumin/Globulin Ratio 1.8 1.2 - 2.2   Bilirubin Total 0.6 0.0 - 1.2 mg/dL   Alkaline Phosphatase 103 39 - 117 IU/L   AST  31 0 - 40 IU/L   ALT 17 0 - 44 IU/L  Uric acid  Result Value Ref Range   Uric Acid 8.3 3.7 - 8.6 mg/dL      Assessment & Plan:   Problem List Items Addressed This Visit      Unprioritized   RESOLVED: Ankle swelling, right    Resolved.  Uric acid upper limits of normal.  Will continue to monitor if symptoms reoccur      GERD (gastroesophageal reflux disease)    Stable, continue present medications.        Hypertension    Stable, continue present medications.            Follow up plan: Return in about 6 months (around 01/03/2018).

## 2017-07-03 NOTE — Assessment & Plan Note (Signed)
Stable, continue present medications.   

## 2017-08-31 ENCOUNTER — Other Ambulatory Visit: Payer: Self-pay | Admitting: Unknown Physician Specialty

## 2017-08-31 NOTE — Telephone Encounter (Signed)
HCTZ refill Last Refill:05/02/17 # 90  Last OV: 07/03/17 PCP: Gabriel Cirriheryl Wicker NP Pharmacy:[facility]

## 2017-09-18 ENCOUNTER — Other Ambulatory Visit: Payer: Self-pay | Admitting: Unknown Physician Specialty

## 2017-10-05 DIAGNOSIS — I469 Cardiac arrest, cause unspecified: Secondary | ICD-10-CM | POA: Diagnosis not present

## 2017-10-28 DIAGNOSIS — 419620001 Death: Secondary | SNOMED CT | POA: Diagnosis not present

## 2017-10-28 DEATH — deceased

## 2017-11-09 ENCOUNTER — Ambulatory Visit: Payer: Self-pay

## 2018-01-04 ENCOUNTER — Ambulatory Visit: Payer: Medicare Other | Admitting: Unknown Physician Specialty

## 2018-04-01 NOTE — Telephone Encounter (Signed)
closing encounter
